# Patient Record
Sex: Female | Born: 1940 | Race: White | Hispanic: No | Smoking: Former smoker
Health system: Southern US, Community
[De-identification: ages and names within clinical notes are randomized; demographics above are authoritative.]

## PROBLEM LIST (undated history)

## (undated) DIAGNOSIS — M797 Fibromyalgia: Secondary | ICD-10-CM

## (undated) DIAGNOSIS — J449 Chronic obstructive pulmonary disease, unspecified: Secondary | ICD-10-CM

## (undated) HISTORY — DX: Chronic obstructive pulmonary disease, unspecified: J44.9

## (undated) HISTORY — PX: TOTAL ABDOMINAL HYSTERECTOMY: SHX209

## (undated) HISTORY — DX: Fibromyalgia: M79.7

---

## 1995-05-17 ENCOUNTER — Encounter: Payer: Self-pay | Admitting: Internal Medicine

## 2004-04-24 ENCOUNTER — Ambulatory Visit: Payer: Self-pay | Admitting: Internal Medicine

## 2004-06-23 ENCOUNTER — Ambulatory Visit: Payer: Self-pay | Admitting: Internal Medicine

## 2004-08-26 ENCOUNTER — Ambulatory Visit: Payer: Self-pay | Admitting: Internal Medicine

## 2004-12-16 ENCOUNTER — Ambulatory Visit: Payer: Self-pay | Admitting: Internal Medicine

## 2005-04-19 ENCOUNTER — Ambulatory Visit: Payer: Self-pay | Admitting: Internal Medicine

## 2005-09-02 ENCOUNTER — Ambulatory Visit: Payer: Self-pay | Admitting: Internal Medicine

## 2005-09-03 ENCOUNTER — Ambulatory Visit: Payer: Self-pay | Admitting: Internal Medicine

## 2005-12-22 ENCOUNTER — Ambulatory Visit: Payer: Self-pay | Admitting: Internal Medicine

## 2006-04-01 ENCOUNTER — Ambulatory Visit: Payer: Self-pay | Admitting: Internal Medicine

## 2006-07-20 ENCOUNTER — Ambulatory Visit: Payer: Self-pay | Admitting: Internal Medicine

## 2006-08-16 ENCOUNTER — Ambulatory Visit: Payer: Self-pay | Admitting: Internal Medicine

## 2006-11-09 ENCOUNTER — Ambulatory Visit: Payer: Self-pay | Admitting: Internal Medicine

## 2007-02-22 ENCOUNTER — Ambulatory Visit: Payer: Self-pay | Admitting: Internal Medicine

## 2007-06-21 ENCOUNTER — Ambulatory Visit: Payer: Self-pay | Admitting: Internal Medicine

## 2007-08-15 DIAGNOSIS — J302 Other seasonal allergic rhinitis: Secondary | ICD-10-CM

## 2007-08-15 DIAGNOSIS — J3089 Other allergic rhinitis: Secondary | ICD-10-CM

## 2007-08-15 HISTORY — DX: Other seasonal allergic rhinitis: J30.2

## 2007-08-16 ENCOUNTER — Ambulatory Visit: Payer: Self-pay | Admitting: Internal Medicine

## 2007-10-25 ENCOUNTER — Ambulatory Visit: Payer: Self-pay | Admitting: Internal Medicine

## 2008-02-16 ENCOUNTER — Ambulatory Visit: Payer: Self-pay | Admitting: Internal Medicine

## 2008-06-06 ENCOUNTER — Ambulatory Visit: Payer: Self-pay | Admitting: Internal Medicine

## 2008-08-14 ENCOUNTER — Encounter: Payer: Self-pay | Admitting: Internal Medicine

## 2008-08-19 ENCOUNTER — Telehealth (INDEPENDENT_AMBULATORY_CARE_PROVIDER_SITE_OTHER): Payer: Self-pay | Admitting: *Deleted

## 2008-08-20 ENCOUNTER — Encounter: Payer: Self-pay | Admitting: Internal Medicine

## 2008-08-21 ENCOUNTER — Ambulatory Visit: Payer: Self-pay | Admitting: Internal Medicine

## 2008-09-02 ENCOUNTER — Telehealth: Payer: Self-pay | Admitting: Internal Medicine

## 2008-09-03 ENCOUNTER — Encounter: Payer: Self-pay | Admitting: Internal Medicine

## 2008-09-06 ENCOUNTER — Telehealth (INDEPENDENT_AMBULATORY_CARE_PROVIDER_SITE_OTHER): Payer: Self-pay | Admitting: *Deleted

## 2008-09-09 ENCOUNTER — Ambulatory Visit: Payer: Self-pay | Admitting: Internal Medicine

## 2008-10-09 ENCOUNTER — Ambulatory Visit: Payer: Self-pay | Admitting: Internal Medicine

## 2008-10-19 ENCOUNTER — Encounter: Payer: Self-pay | Admitting: Internal Medicine

## 2008-10-22 ENCOUNTER — Ambulatory Visit: Payer: Self-pay | Admitting: Internal Medicine

## 2008-11-11 ENCOUNTER — Ambulatory Visit: Payer: Self-pay | Admitting: Internal Medicine

## 2008-12-09 ENCOUNTER — Ambulatory Visit: Payer: Self-pay | Admitting: Internal Medicine

## 2009-06-17 ENCOUNTER — Ambulatory Visit: Payer: Self-pay | Admitting: Internal Medicine

## 2009-06-23 ENCOUNTER — Encounter: Payer: Self-pay | Admitting: Internal Medicine

## 2009-06-30 ENCOUNTER — Telehealth: Payer: Self-pay | Admitting: Internal Medicine

## 2009-08-20 ENCOUNTER — Ambulatory Visit: Payer: Self-pay | Admitting: Internal Medicine

## 2009-08-20 DIAGNOSIS — R0902 Hypoxemia: Secondary | ICD-10-CM | POA: Insufficient documentation

## 2009-08-20 HISTORY — DX: Hypoxemia: R09.02

## 2009-08-27 ENCOUNTER — Encounter: Payer: Self-pay | Admitting: Internal Medicine

## 2009-10-09 ENCOUNTER — Ambulatory Visit: Payer: Self-pay | Admitting: Internal Medicine

## 2009-10-21 ENCOUNTER — Ambulatory Visit: Payer: Self-pay | Admitting: Internal Medicine

## 2009-10-21 DIAGNOSIS — J410 Simple chronic bronchitis: Secondary | ICD-10-CM

## 2009-10-21 HISTORY — DX: Simple chronic bronchitis: J41.0

## 2010-02-25 ENCOUNTER — Ambulatory Visit: Payer: Self-pay | Admitting: Internal Medicine

## 2010-03-10 NOTE — Miscellaneous (Signed)
Summary: Injection Record/[facility]  Injection Record/[facility]   Imported By: Sherian Rein 07/01/2009 08:48:04  _____________________________________________________________________  External Attachment:    Type:   Image     Comment:   External Document

## 2010-03-10 NOTE — Miscellaneous (Signed)
Summary: Injection Orders / [facility]    Injection Orders / [facility]    Imported By: Lennie Odor 07/08/2009 15:43:01  _____________________________________________________________________  External Attachment:    Type:   Image     Comment:   External Document

## 2010-03-10 NOTE — Progress Notes (Signed)
Summary: FYI- her primary MD has dx'd  Asthma  Phone Note Call from Patient Call back at Home Phone [phone]   Caller: Patient Call For: Jayden Rudge Summary of Call: FYI: Pt states she was diagnosed with asthma by her primary and that she would bring in xray disks when she comes for her 7/13 appt with [facility]. Initial call taken by: Darletta Moll,  Jun 30, 2009 11:54 AM  Follow-up for Phone Call        per pt FYI only. Carron Curie CMA  Jun 30, 2009 11:55 AM   Additional Follow-up for Phone Call Additional follow up Details #1::        Noted Additional Follow-up by: Waymon Budge MD,  Jun 30, 2009 1:35 PM

## 2010-03-10 NOTE — Assessment & Plan Note (Signed)
Summary: 2 month return/mhh   Primary Provider/Referring Provider:  [facility]/ Dr [name]  CC:  f/u allergies.  History of Present Illness:  August 20, 2009- Allergic rhiniits, Asthma Had a chest CT for wheezing dyspnea, done at [facility] with disk brought for my review.. Images reviewed. No focal lesion- Report agrees, WNL. They had given her a  Proair and we discussed use- few times/ week.  Since last here has been giving her own allergy shots. Has Epipen. I discussed risk, goals and use of Epipen. Sat here today 88% on arrival- 91% with rest. Denies routine cough or wheeze. She is aware of exertional dyspnea more over the past year. She denies chest pain, palpitation, edema.  October 21, 2009- Allergic rhinitis, COPD Brings PFT done at [facility]- mild obstruction w/o response to bronchodilator. DLCO reduced. Has used rescue inhaler not more than 3-4 times since last here in July. Hasn't needed her rescue inhaer. Only noticed wheezing once since last here- mild then- while she was lying down.  Discussed her smoking hx and also the effect of her weight. She is trying now to lose weight.  No hx of ischemic heart disease, but on atenolol because of hx of heart murmur. Denies hx of anemia or blood clots. Negative contrast CT in May- reviewed. Feels better now that weather is starting to cool.  She admits slow walking and limiting dyspnea on stairs, inclines or brisk walking. No hx glaucoma.  PFT-08/27/09- Mild obstructive disease w/o rep to BD. FEV1 1.84/ 82%; FEV1/FVC 0.64, reduced small airways. DLCO Reduced 53%   Preventive Screening-Counseling & Management  Alcohol-Tobacco     Smoking Status: quit > 6 months     Packs/Day: 1.0     Year Started: age 11     Year Quit: 1990  Current Medications (verified): 1)  Atenolol 100 Mg  Tabs (Atenolol) .... Once Daily 2)  Hyzaar 100-12.5 Mg Tabs (Losartan Potassium-Hctz) .... Take 1 Tablet By Mouth Once A Day 3)  Bupropion Hcl 150  Mg  Tb12 (Bupropion Hcl) .Marland Kitchen.. 1 Tab Two Times A Day 4)  Alprazolam 0.5 Mg  Tbdp (Alprazolam) .... Once Daily 5)  Claritin-D 12 Hour 5-120 Mg  Tb12 (Loratadine-Pseudoephedrine) .... Once Daily 6)  Allergy Vaccine Restart Go (W-E) .... Rebuild To 1:50 7)  Epipen 0.3 Mg/0.56ml (1:1000)  Devi (Epinephrine Hcl (Anaphylaxis)) .... As Needed 8)  Ventolin Hfa 108 (90 Base) Mcg/act Aers (Albuterol Sulfate) .Marland Kitchen.. 1-2 Puffs By Mouth Every 6 Hours As Needed 9)  Amoxil Before Dental Procedures 10)  Albuterol Sulfate (2.5 Mg/60ml) 0.083% Nebu (Albuterol Sulfate) .... As Directed  Allergies (verified): 1)  ! Sulfa 2)  ! Codeine  Past History:  Past Surgical History: Last updated: 09/03/2008 Total Abdominal Hysterectomy  Family History: Last updated: 2008/09/03 mother deceased age 58 from pneumonia  hx o allergies/arthritis father deceased age 77 from massive MI 1 sibling alive age 35 1 sibling alive age 65  hx of bipolar 1 silbing alive age 37  hx of DM son has hx of DM  Social History: Last updated: 09/03/2008 quit smoking in 1990  smoked for 20 years exposed to second hand smoke no caffeine use never any drug use no alcohol use widowed 3 children  Risk Factors: Smoking Status: quit > 6 months (10/21/2009) Packs/Day: 1.0 (10/21/2009)  Past Medical History: Allergic Rhinitis Skint test pos 1997, 03-Sep-2008 COPD- 08/27/09- FEV1 1.84/ 82%; R 0.64; 25-75% 0.79/ 40%; DLCO 53%. No resp to BD fibromyalgia allergic conjunctivitis  Social History: Packs/Day:  1.0  Review of Systems      See HPI       The patient complains of shortness of breath with activity and weight change.  The patient denies shortness of breath at rest, productive cough, non-productive cough, coughing up blood, chest pain, irregular heartbeats, acid heartburn, indigestion, loss of appetite, abdominal pain, difficulty swallowing, sore throat, tooth/dental problems, headaches, nasal congestion/difficulty breathing through  nose, and sneezing.    Vital Signs:  Patient profile:   70 year old female Height:      64 inches Weight:      221.50 pounds BMI:     38.16 O2 Sat:      93 % on Room air Pulse rate:   66 / minute BP sitting:   126 / 84  (left arm) Cuff size:   regular  Vitals Entered By: Vivianne Spence  O2 Flow:  Room air CC: f/u allergies Comments Medications reviewed with patient ,pt. phone # verified. Elray Buba RN  October 21, 2009 11:04 AM    Physical Exam  Additional Exam:  General: A/Ox3; pleasant and cooperative, NAD, obese, dynamic/ talkative SKIN: no rash, lesions NODES: no lymphadenopathy HEENT: [place]/AT, EOM- WNL, Conjuctivae- clear, PERRLA, TM-WNL, Nose- clear, Throat- clear and wnl, Mallampati III, tonsils present, not hoarse, no stridor, drainage or erythema NECK: Supple w/ fair ROM, JVD- none, normal carotid impulses w/o bruits Thyroid-  CHEST: Clear to P&A, distant HEART: RRR, no m/g/r heard ABDOMEN: Soft and nl; overweight ZOX:WRUE, nl pulses, no edema  NEURO: Grossly intact to observation      Impression & Recommendations:  Problem # 1:  HYPOXEMIA (ICD-799.02)  PFT favors her former smoking and small airways obstructive disease, plus her obesity and deconditioning as the main reasons for exertional dyspnea. She is trying to lose some weight, has no access to a pulmonary rehab program at home, but can do at least some walking for endurance. We will give sample Spiriva to try, but I don't expect much progress on weight .  Flu vaccine discussed and requested.  Problem # 2:  COPD (ICD-496) As discussed above. Not much active bronchits.  Other Orders: Est. Patient Level IV (45409) Flu Vaccine 54yrs + MEDICARE PATIENTS (W1191) Administration Flu vaccine - MCR (Y7829)  Patient Instructions: 1)  Please schedule a follow-up appointment in 6 months. 2)  Sample Spiriva - 1  daily . 3)  See if it gradually improves your shortness of breat 4)  Flu vax    Flu  Vaccine Consent Questions     Do you have a history of severe allergic reactions to this vaccine? no    Any prior history of allergic reactions to egg and/or gelatin? no    Do you have a sensitivity to the preservative Thimersol? no    Do you have a past history of Guillan-Barre Syndrome? no    Do you currently have an acute febrile illness? no    Have you ever had a severe reaction to latex? no    Vaccine information given and explained to patient? yes    Are you currently pregnant? no    Lot Number:[id number]   Exp Date:08/08/2010   Site Given  Left Deltoid IMflu  Elray Buba RN  October 21, 2009 12:07 PM

## 2010-03-10 NOTE — Assessment & Plan Note (Signed)
Summary: 1 year/ mbw   Primary Provider/Referring Provider:  [facility]/ Dr [name]  CC:  1 year follow up and c/o sob worse with exertion 02 sats were 88% with ambulation.  History of Present Illness: Primary Provider/Referring Provider:  [facility]/ Dr Jonny Ruiz Kihm7/09- ALLERGIC RHINITIS (ICD-477.9) 09-18-2008- Allergic rhinitis In May got head congestion and started erythromycin, but blames herself for delaying a day. Got laryngitis, no cough, throat was irritiated but no fever or chills. Went to [facility] June 2. Got Zpak, Rocephin inj. CXR ?'d pneumonia. Ret f/u no better June 12- Stil head and sinus congestion. Given neb Rx, levaquin, prednisone, Ventolin inhaler. She noted some positional wheeze. Third visit- Extended Levaquin and prednisone. V/Q was very low probability for PE. CXR report we have from 7/3 was clear- NAD. Last November-. Began itching, no rash- went to 2 diffrerent dermatologists. The second gave her hydroxyzine 10 mg. They suggested she have repeat allergy testing. Uses Eucerin, changed detergents to [place].  Acutely-. She had CXR- NAD, minor fibrotic changes. VQ Lung Scan -very low probability after low oximetry and dyspnea. Today- still raspy feeling in throat. She still itches, off hydroxyzine and claritin, no rash. Chest feels ok.  Skin tests- Pos- grass, weed, tree, dust, mold.  August 20, 2009- Allergic rhiniits, Asthma Had a chest CT for wheezing dyspnea, done at [facility] with disk brought for my review.. Images reviewed. No focal lesion- Report agrees, WNL. They had given her a  Proair and we discussed use- few times/ week.  Since last here has been giving her own allergy shots. Has Epipen. I discussed risk, goals and use of Epipen. Sat here today 88% on arrival- 91% with rest. Denies routine cough or wheeze. She is aware of exertional dyspnea more over the past year. She denies chest pain, palpitation, edema.    Preventive  Screening-Counseling & Management  Alcohol-Tobacco     Smoking Status: quit > 6 months  Current Medications (verified): 1)  Atenolol 100 Mg  Tabs (Atenolol) .... Once Daily 2)  Hyzaar 100-12.5 Mg Tabs (Losartan Potassium-Hctz) .... Take 1 Tablet By Mouth Once A Day 3)  Bupropion Hcl 150 Mg  Tb12 (Bupropion Hcl) .Marland Kitchen.. 1 Tab Two Times A Day 4)  Alprazolam 0.5 Mg  Tbdp (Alprazolam) .... Once Daily 5)  Claritin-D 12 Hour 5-120 Mg  Tb12 (Loratadine-Pseudoephedrine) .... Once Daily 6)  Allergy Vaccine Restart Go (W-E) .... Rebuild To 1:50 7)  Epipen 0.3 Mg/0.36ml (1:1000)  Devi (Epinephrine Hcl (Anaphylaxis)) .... As Needed 8)  Ventolin Hfa 108 (90 Base) Mcg/act Aers (Albuterol Sulfate) .Marland Kitchen.. 1-2 Puffs By Mouth Every 6 Hours As Needed 9)  Amoxil Before Dental Procedures 10)  Albuterol Sulfate (2.5 Mg/58ml) 0.083% Nebu (Albuterol Sulfate) .... As Directed  Allergies (verified): 1)  ! Sulfa 2)  ! Codeine  Past History:  Past Medical History: Last updated: 09-18-2008 Allergic Rhinitis Skint test pos 1997, Sep 18, 2008 fibromyalgia allergic conjunctivitis  Past Surgical History: Last updated: September 18, 2008 Total Abdominal Hysterectomy  Family History: Last updated: 2008-09-18 mother deceased age 34 from pneumonia  hx o allergies/arthritis father deceased age 91 from massive MI 1 sibling alive age 46 1 sibling alive age 11  hx of bipolar 1 silbing alive age 16  hx of DM son has hx of DM  Social History: Last updated: 18-Sep-2008 quit smoking in 1990  smoked for 20 years exposed to second hand smoke no caffeine use never any drug use no alcohol use widowed 3 children  Risk  Factors: Smoking Status: quit > 6 months (08/20/2009)  Social History: Smoking Status:  quit > 6 months  Review of Systems      See HPI       The patient complains of shortness of breath with activity.  The patient denies shortness of breath at rest, productive cough, non-productive cough, coughing up blood,  chest pain, irregular heartbeats, acid heartburn, indigestion, loss of appetite, weight change, abdominal pain, difficulty swallowing, sore throat, tooth/dental problems, headaches, nasal congestion/difficulty breathing through nose, and sneezing.    Vital Signs:  Patient profile:   70 year old female Height:      64 inches Weight:      232 pounds BMI:     39.97 O2 Sat:      88 % on Room air Pulse rate:   72 / minute BP sitting:   120 / 60  (left arm) Cuff size:   large  Vitals Entered By: Renold Genta RCP, LPN (August 20, 2009 11:01 AM)  O2 Sat at Rest %:  88% O2 Flow:  Room air  O2 Sat Comments 02 sats 88% with ambulation and increased to 94% after rest CC: 1 year follow up, c/o sob worse with exertion 02 sats were 88% with ambulation Comments Medications reviewed with patient Renold Genta RCP, LPN  August 20, 2009 11:12 AM    Physical Exam  Additional Exam:  General: A/Ox3; pleasant and cooperative, NAD, obese, dynamic/ talkative SKIN: no rash, lesions NODES: no lymphadenopathy HEENT: Uncertain/AT, EOM- WNL, Conjuctivae- clear, PERRLA, TM-WNL, Nose- clear, Throat- clear and wnl, Mallampati III, tonsils present, not hoarse, no stridor, drainage or erythema NECK: Supple w/ fair ROM, JVD- none, normal carotid impulses w/o bruits Thyroid-  CHEST: Clear to P&A, distant HEART: RRR, no m/g/r heard ABDOMEN: Soft and nl;  QVZ:DGLO, nl pulses, no edema  NEURO: Grossly intact to observation      Impression & Recommendations:  Problem # 1:  ALLERGIC RHINITIS (ICD-477.9)  This has been controlled with her allergy vaccine and occasional antihistamine. She seems comfortable continuing.  Problem # 2:  HYPOXEMIA (ICD-799.02)  CT to my review doesn't show a significant structural abnormality, but I want to get the report. Her hypoxia may just be obesity hypoventilation, but she did smoke a long time. We need to get PFT done and it will be easier to get it through [place]. She has a  rescue inhaler and we discussed occasional use, although I can't tell that she wheezes much. This hypoxemia might come from a fixed asthma and her weight, but chronic left ventricular failure, chronic thromboembolic disease might also do it. PFT should help, since structural abnormalities weren't seen on CT.  Medications Added to Medication List This Visit: 1)  Albuterol Sulfate (2.5 Mg/66ml) 0.083% Nebu (Albuterol sulfate) .... As directed  Other Orders: Est. Patient Level III (75643) Est. Patient Level IV (32951)  Patient Instructions: 1)  Please schedule a follow-up appointment in 2 months. 2)  Script to show your doctor for help getting referral for Pulmonary Function Test. 3)  Continue allergy shots 4)  Use the rescue inhaler Proair: 2 puffs, up to 4 x daily when needed. You can skip it when you don't need it for chest tightness, wheezing.

## 2010-04-21 ENCOUNTER — Ambulatory Visit (INDEPENDENT_AMBULATORY_CARE_PROVIDER_SITE_OTHER): Payer: Medicare Other | Admitting: Internal Medicine

## 2010-04-21 ENCOUNTER — Encounter: Payer: Self-pay | Admitting: Internal Medicine

## 2010-04-21 DIAGNOSIS — J449 Chronic obstructive pulmonary disease, unspecified: Secondary | ICD-10-CM

## 2010-04-21 DIAGNOSIS — J309 Allergic rhinitis, unspecified: Secondary | ICD-10-CM

## 2010-04-28 NOTE — Assessment & Plan Note (Signed)
Summary: 6 month rov   Primary Provider/Referring Provider:  [facility]/ Dr [name]  CC:  6 month follow up.  Pt states breathing is doing well overall.  Marland Kitchen  History of Present Illness:  October 21, 2009- Allergic rhinitis, COPD Brings PFT done at [facility]- mild obstruction w/o response to bronchodilator. DLCO reduced. Has used rescue inhaler not more than 3-4 times since last here in July. Hasn't needed her rescue inhaer. Only noticed wheezing once since last here- mild then- while she was lying down.  Discussed her smoking hx and also the effect of her weight. She is trying now to lose weight.  No hx of ischemic heart disease, but on atenolol because of hx of heart murmur. Denies hx of anemia or blood clots. Negative contrast CT in May- reviewed. Feels better now that weather is starting to cool.  She admits slow walking and limiting dyspnea on stairs, inclines or brisk walking. No hx glaucoma.  PFT-08/27/09- Mild obstructive disease w/o rep to BD. FEV1 1.84/ 82%; FEV1/FVC 0.64, reduced small airways. DLCO Reduced 53%  April 21, 2010- Allergic rhinitis, COPD Nurse-CC: 6 month follow up.  Pt states breathing is doing well overall.  We had given her Spiriva and advised exercise to improve DOE at last visit.  Her breathing has seemed better than she expected since last here. Gargle warm saline and doing two times a day nasal saline rinse to ward off colds.  Doing well with allergy vaccine at 1:50 with no reactions. We discussed moving up to 1:10.       Preventive Screening-Counseling & Management  Alcohol-Tobacco     Smoking Status: quit > 6 months     Packs/Day: 1.0     Year Started: age 14     Year Quit: 1990  Current Medications (verified): 1)  Atenolol 100 Mg  Tabs (Atenolol) .... Once Daily 2)  Hyzaar 100-12.5 Mg Tabs (Losartan Potassium-Hctz) .... Take 1 Tablet By Mouth Once A Day 3)  Bupropion Hcl 150 Mg  Tb12 (Bupropion Hcl) .Marland Kitchen.. 1 Tab Two Times A Day 4)   Alprazolam 0.5 Mg  Tbdp (Alprazolam) .... Once Daily 5)  Claritin-D 12 Hour 5-120 Mg  Tb12 (Loratadine-Pseudoephedrine) .... Once Daily 6)  Allergy Vaccine Restart Go (W-E) .... Rebuild To 1:50 7)  Epipen 0.3 Mg/0.59ml (1:1000)  Devi (Epinephrine Hcl (Anaphylaxis)) .... As Needed 8)  Ventolin Hfa 108 (90 Base) Mcg/act Aers (Albuterol Sulfate) .Marland Kitchen.. 1-2 Puffs By Mouth Every 6 Hours As Needed 9)  Amoxil Before Dental Procedures 10)  Albuterol Sulfate (2.5 Mg/31ml) 0.083% Nebu (Albuterol Sulfate) .... As Directed  Allergies (verified): 1)  ! Sulfa 2)  ! Codeine  Past History:  Past Medical History: Last updated: 10/21/2009 Allergic Rhinitis Skint test pos 1997, 08/22/08 COPD- 08/27/09- FEV1 1.84/ 82%; R 0.64; 25-75% 0.79/ 40%; DLCO 53%. No resp to BD fibromyalgia allergic conjunctivitis  Past Surgical History: Last updated: 22-Aug-2008 Total Abdominal Hysterectomy  Family History: Last updated: 2008/08/22 mother deceased age 38 from pneumonia  hx o allergies/arthritis father deceased age 52 from massive MI 1 sibling alive age 58 1 sibling alive age 63  hx of bipolar 1 silbing alive age 62  hx of DM son has hx of DM  Social History: Last updated: 08/22/2008 quit smoking in 1990  smoked for 20 years exposed to second hand smoke no caffeine use never any drug use no alcohol use widowed 3 children  Risk Factors: Smoking Status: quit > 6 months (04/21/2010) Packs/Day: 1.0 (04/21/2010)  Review of Systems      See HPI       The patient complains of shortness of breath with activity.  The patient denies shortness of breath at rest, productive cough, non-productive cough, coughing up blood, chest pain, irregular heartbeats, acid heartburn, indigestion, loss of appetite, weight change, abdominal pain, difficulty swallowing, sore throat, tooth/dental problems, headaches, nasal congestion/difficulty breathing through nose, and sneezing.    Vital Signs:  Patient profile:   70 year  old female Height:      65 inches Weight:      196.13 pounds BMI:     32.76 BP sitting:   108 / 58  (right arm) Cuff size:   large  Vitals Entered By: Gweneth Dimitri RN (April 21, 2010 11:08 AM)  O2 Flow:  Room air CC: 6 month follow up.  Pt states breathing is doing well overall.   Comments Medications reviewed with patient Daytime contact number verified with patient. Gweneth Dimitri RN  April 21, 2010 11:09 AM    Physical Exam  Additional Exam:  General: A/Ox3; pleasant and cooperative, NAD, obese, dynamic/ talkative, looks calm SKIN: no rash, lesions NODES: no lymphadenopathy HEENT: [place]/AT, EOM- WNL, Conjuctivae- clear, PERRLA, TM-WNL, Nose- clear, Throat- clear and wnl,  Mallampati III, tonsils present, not hoarse, no stridor, drainage or erythema NECK: Supple w/ fair ROM, JVD- none, normal carotid impulses w/o bruits Thyroid-  CHEST: Clear to P&A, distant HEART: RRR, no m/g/r heard ABDOMEN: Soft and nl; overweight ZOX:WRUE, nl pulses, no edema  NEURO: Grossly intact to observation      Impression & Recommendations:  Problem # 1:  COPD (ICD-496) Doing well, comfortable appearing and very clear today.  Problem # 2:  ALLERGIC RHINITIS (ICD-477.9)  Doing very well.Stll notes postnasal drip she says erythromycin helps, to prevent strep throats.  We will try move up to 1:10- discussed.   Other Orders: Est. Patient Level III (45409)  Patient Instructions: 1)  Please schedule a follow-up appointment in 6 months. 2)  I will have the allergy lab increase your vaccine strength to 1:10 next time you order. They will send directions. Please call as needed. 3)  Epipen refilled in in case of severe allergic reaction.  Prescriptions: EPIPEN 0.3 MG/0.3ML (1:1000)  DEVI (EPINEPHRINE HCL (ANAPHYLAXIS)) as needed  #1 x prn   Entered and Authorized by:   Waymon Budge MD   Signed by:   Waymon Budge MD on 04/21/2010   Method used:   Print then Give to Patient   RxID:    [id number]

## 2010-06-23 NOTE — Assessment & Plan Note (Signed)
[facility]                             PULMONARY OFFICE NOTE   NAME:[name], [name]                         MRN:          [id number]  DATE:08/16/2006                            DOB:          01/25/1941    PROBLEM:  1. Allergic rhinitis.  2. Allergic conjunctivitis.  3. Fibromyalgia.   HISTORY:  One year followup.  She has continued to do quite well with  her allergy vaccine at 1:10.  She has not needed her EpiPen and has had  no significant reactions to her vaccine.  She feels her injections help.  She had a little bit of epistaxis during the winter consistent with dry  air but no asthma since she has begun her allergy vaccine and no  concerns.   MEDICATIONS:  1. Atenolol 100 mg.  2. P.r.n. Claritin.  3. Vitamin B-12.  4. Cozaar 100 mg.  5. Xanax 1/2 of a 5 mg tab used most days.  6. Occasional Benadryl.  7. Amoxicillin before dental procedures (not sure this is a current      recommendation for her).   DRUG INTOLERANT SULFA AND CODEINE.   OBJECTIVE:  Weight 242 pounds, blood pressure 142/72, pulse 70, room air  saturation 95%.  This is an obese, alert woman.  Nasal airway is  unobstructed, conjunctivae are clear, nasal mucosa looks normal, there  is no mucus of significance and no polyps.  Her pharynx is clear, voice  quality normal with no stridor.  I do not find adenopathy.  LUNGS:  Fields are clear and breathing is unlabored.  HEART:  Sounds are regular without murmur.   IMPRESSION:  1. Allergic rhinitis.  2. Allergic conjunctivitis, adequately controlled.   PLAN:  Continue allergy vaccine.  Suggest nasal saline gel during winter  dryness.  Schedule return one year, earlier p.r.n.    Clinton D. Maple Hudson, MD, [name], FACP  Electronically Signed   CDY/MedQ  DD: 08/30/2006  DT: 08/30/2006  Job #: [id number]   cc:   [name]

## 2010-06-26 NOTE — Assessment & Plan Note (Signed)
[facility]                               PULMONARY OFFICE NOTE   NAME:SPIVEYTanganyika, Bowlds                         MRN:          [id number]  DATE:09/03/2005                            DOB:          06-13-1940    PROBLEM:  1.  Allergic rhinitis.  2.  Allergic conjunctivitis.  3.  Fibromyalgia.   HISTORY:  This is a one-year followup for Mrs. Siebel who takes daily  Claritin.  She says that she has had little itching of the eyes this spring  and summer.  She uses Neosporin and occasional Benadryl for irritation in  her nose and says she has no asthma as long as she takes her allergy shots.  She has been giving her own injections, and I talked today with her about  risk benefit considerations, anaphylaxis, Epi-Pen, beta blocker interference  with Epi-Pen and policy and advice concerning administration outside of a  medical office.  She has reviewed and signed a waiver form and chooses to  accept the risk of continuing to give her own injections.  She is needing to  establish a new primary physician, as hers apparently is moving or changing  it's practice.   MEDICATIONS:  1.  Atenolol 100 mg.  2.  Multivitamins.  3.  Potassium.  4.  Claritin.  5.  Magnesium.  6.  Cozaar 100 mg.  7.  Allergy vaccine.  8.  Xanax.  9.  Benadryl.  10. Amoxicillin for dental procedures.  11. Epi Pen.  12. Drug intolerant of sulfa and codeine.   OBJECTIVE:  VITAL SIGNS:  Weight 242 pounds, BP 142/72, pulse regular 70,  room air saturation 95%.  GENERAL:  She is obese, alert, a very comfortable-appearing woman.  SKIN:  No evident rash, adenopathy:  None at the neck, shoulders or axillae.  HEENT:  Conjunctivae are clear.  Nasal mucosa looks normal.  Pharynx is not  injected.  There is no visible postnasal drainage, and voice quality is  normal.  NECK:  Without stridor or neck vein distention.  CHEST:  Quiet, clear lung fields.  No cough, wheeze or rales.  HEART:   Sounds are regular without murmur or gallop.  EXTREMITIES:  Without cyanosis, clubbing or edema.   IMPRESSION:  Clinically, she is stable.  I think she is able to make  thoughtful decisions in her own best interest.  She is going to discuss  allergy vaccine administration with her new primary physician when  established.  Schedule return visit here in one year, earlier p.r.n.                                   Clinton D. Maple Hudson, MD, [facility], FACP   CDY/MedQ  DD:  09/06/2005  DT:  09/06/2005  Job #:  [id number]   cc:   Collier Flowers, M.D.

## 2010-08-09 DIAGNOSIS — I214 Non-ST elevation (NSTEMI) myocardial infarction: Secondary | ICD-10-CM

## 2010-08-09 HISTORY — DX: Non-ST elevation (NSTEMI) myocardial infarction: I21.4

## 2010-08-24 HISTORY — PX: CAROTID STENT: SHX1301

## 2010-09-25 ENCOUNTER — Ambulatory Visit (INDEPENDENT_AMBULATORY_CARE_PROVIDER_SITE_OTHER): Payer: Medicare Other

## 2010-09-25 DIAGNOSIS — J309 Allergic rhinitis, unspecified: Secondary | ICD-10-CM

## 2010-10-22 ENCOUNTER — Ambulatory Visit (INDEPENDENT_AMBULATORY_CARE_PROVIDER_SITE_OTHER): Payer: Medicare Other | Admitting: Internal Medicine

## 2010-10-22 ENCOUNTER — Encounter: Payer: Self-pay | Admitting: Internal Medicine

## 2010-10-22 VITALS — BP 136/78 | HR 67 | Ht 65.0 in | Wt 196.4 lb

## 2010-10-22 DIAGNOSIS — J449 Chronic obstructive pulmonary disease, unspecified: Secondary | ICD-10-CM

## 2010-10-22 DIAGNOSIS — J309 Allergic rhinitis, unspecified: Secondary | ICD-10-CM

## 2010-10-22 DIAGNOSIS — Z23 Encounter for immunization: Secondary | ICD-10-CM

## 2010-10-22 NOTE — Patient Instructions (Signed)
Flu vax  Continue your allergy shots.   Please call us as needed.

## 2010-10-22 NOTE — Assessment & Plan Note (Signed)
Good control Ok to continue allergy vaccine

## 2010-10-22 NOTE — Progress Notes (Signed)
Subjective:    Patient ID: [name], female    DOB: Mar 21, 1940, 70 y.o.   MRN: [id number]  HPI 10/22/10- 69 year old female former smoker followed for allergic rhinitis, COPD, complicated by CAD/MI Last here March 13,2012- has been in hosp twice for MI by enzymes- had Mi- [place] then [facility] after cardiac cath with stent. Cortisone shot for sore left shoulder yesterday.  Allergy - no problem with fall season so far. Continues allergy shots with no problem.  Denies limiting SOB, cough or wheeze now.  She brings an extremely long list of meds, supplements and etc.   Review of Systems Constitutional:   No-   weight loss, night sweats, fevers, chills, fatigue, lassitude. HEENT:   No-  headaches, difficulty swallowing, tooth/dental problems, sore throat,       No-  sneezing, itching, ear ache, nasal congestion, post nasal drip,  CV:  No-   chest pain, orthopnea, PND, swelling in lower extremities, anasarca, dizziness, palpitations Resp: No- acute  shortness of breath with exertion or at rest.              No-   productive cough,  No non-productive cough,  No-  coughing up of blood.              No-   change in color of mucus.  No- wheezing.   Skin: No-   rash or lesions. GI:  No-   heartburn, indigestion, abdominal pain, nausea, vomiting, diarrhea,                 change in bowel habits, loss of appetite GU: No-   dysuria, change in color of urine, no urgency or frequency.  No- flank pain. MS:  No-   joint pain or swelling.  No- decreased range of motion.  No- back pain. Neuro- grossly normal to observation, Or:  Psych:  No- change in mood or affect. No depression or anxiety.  No memory loss.      Objective:   Physical Exam General- Alert, Oriented, Affect-appropriate, Distress- none acute  overweight Skin- rash-none, lesions- none, excoriation- none    Bruise right upper arm Lymphadenopathy- none Head- atraumatic            Eyes- Gross vision intact, PERRLA, conjunctivae  clear secretions            Ears- Hearing, canals- normal            Nose- Clear, no- Septal dev, mucus, polyps, erosion, perforation             Throat- Mallampati II , mucosa clear , drainage- none, tonsils- atrophic, own teeth Neck- flexible , trachea midline, no stridor , thyroid nl, carotid no bruit Chest - symmetrical excursion , unlabored           Heart/CV- RRR , no murmur , no gallop  , no rub, nl s1 s2                           - JVD- none , edema- none, stasis changes- none, varices- none           Lung- clear to P&A, wheeze- none, cough- none , dullness-none, rub- none           Chest wall-  Abd- tender-no, distended-no, bowel sounds-present, HSM- no Br/ Gen/ Rectal- Not done, not indicated Extrem- cyanosis- none, clubbing, none, atrophy- none, strength- nl Neuro- grossly intact to observation  Assessment & Plan:

## 2010-10-22 NOTE — Assessment & Plan Note (Signed)
Controlled and comfortable for now. Flu vax discussed.

## 2010-10-27 ENCOUNTER — Encounter: Payer: Self-pay | Admitting: Internal Medicine

## 2011-01-19 ENCOUNTER — Ambulatory Visit (INDEPENDENT_AMBULATORY_CARE_PROVIDER_SITE_OTHER): Payer: Medicare Other

## 2011-01-19 DIAGNOSIS — J309 Allergic rhinitis, unspecified: Secondary | ICD-10-CM

## 2011-04-22 ENCOUNTER — Ambulatory Visit (INDEPENDENT_AMBULATORY_CARE_PROVIDER_SITE_OTHER): Payer: Medicare Other | Admitting: Internal Medicine

## 2011-04-22 ENCOUNTER — Encounter: Payer: Self-pay | Admitting: Internal Medicine

## 2011-04-22 VITALS — BP 128/60 | HR 65 | Ht 65.0 in | Wt 196.8 lb

## 2011-04-22 DIAGNOSIS — J4489 Other specified chronic obstructive pulmonary disease: Secondary | ICD-10-CM

## 2011-04-22 DIAGNOSIS — J309 Allergic rhinitis, unspecified: Secondary | ICD-10-CM

## 2011-04-22 DIAGNOSIS — J449 Chronic obstructive pulmonary disease, unspecified: Secondary | ICD-10-CM

## 2011-04-22 MED ORDER — PHENYLEPHRINE HCL 1 % NA SOLN
3.0000 [drp] | Freq: Once | NASAL | Status: AC
  Administered 2011-04-22: 3 [drp] via NASAL

## 2011-04-22 MED ORDER — METHYLPREDNISOLONE ACETATE 80 MG/ML IJ SUSP
80.0000 mg | Freq: Once | INTRAMUSCULAR | Status: AC
  Administered 2011-04-22: 80 mg via INTRAMUSCULAR

## 2011-04-22 NOTE — Progress Notes (Signed)
Patient ID: Candi Leash, female    DOB: 1940/03/19, 71 y.o.   MRN: [id number]  HPI 10/22/10- 71 year old female former smoker followed for allergic rhinitis, COPD, complicated by CAD/MI Last here March 13,2012- has been in hosp twice for MI by enzymes- had Mi- [place] then [facility] after cardiac cath with stent. Cortisone shot for sore left shoulder yesterday.  Allergy - no problem with fall season so far. Continues allergy shots with no problem.  Denies limiting SOB, cough or wheeze now.  She brings an extremely long list of meds, supplements and etc.   04/22/11-  71 year old female former smoker followed for allergic rhinitis, COPD, complicated by CAD/MI Continues allergy vaccine without problems. Bronchitis this winter required 2 rounds of Augmentin. Shortness of breath and cough have resolved. Restarted on amoxicillin March 8 for rhinitis with no sinus pain.  Review of Systems-see HPI Constitutional:   No-   weight loss, night sweats, fevers, chills, fatigue, lassitude. HEENT:   No-  headaches, difficulty swallowing, tooth/dental problems, sore throat,       No-  sneezing, itching, ear ache,  +nasal congestion, post nasal drip,  CV:  No-   chest pain, orthopnea, PND, swelling in lower extremities, anasarca, dizziness, palpitations Resp: No- acute  shortness of breath with exertion or at rest.              No-   productive cough,  No non-productive cough,  No-  coughing up of blood.              No-   change in color of mucus.  No- wheezing.   Skin: No-   rash or lesions. GI:  No-   heartburn, indigestion, abdominal pain, nausea, vomiting,  GU:  MS:  No-   joint pain or swelling. . Neuro- grossly normal to observation, Or:  Psych:  No- change in mood or affect. No depression or anxiety.  No memory loss.      Objective:   Physical Exam General- Alert, Oriented, Affect-appropriate, Distress- none acute  overweight Skin- rash-none, lesions- none, excoriation- none    Bruise  right upper arm Lymphadenopathy- none Head- atraumatic            Eyes- Gross vision intact, PERRLA, conjunctivae clear secretions            Ears- Hearing, canals- normal            Nose- Clear, no- Septal dev, mucus, polyps, erosion, perforation             Throat- Mallampati II , mucosa clear , drainage- none, tonsils- atrophic, own teeth Neck- flexible , trachea midline, no stridor , thyroid nl, carotid no bruit Chest - symmetrical excursion , unlabored           Heart/CV- RRR , no murmur , no gallop  , no rub, nl s1 s2                           - JVD- none , edema- none, stasis changes- none, varices- none           Lung- clear to P&A, wheeze- none, cough- none , dullness-none, rub- none           Chest wall-  Abd- tender-no, distended-no, bowel sounds-present, HSM- no Br/ Gen/ Rectal- Not done, not indicated Extrem- cyanosis- none, clubbing, none, atrophy- none, strength- nl Neuro- grossly intact to observation

## 2011-04-22 NOTE — Patient Instructions (Signed)
Neb neo nasal  Depo 80  Sample Qnasl nasal spray -     1 spray each nostril once every day at bedtime

## 2011-04-25 ENCOUNTER — Encounter: Payer: Self-pay | Admitting: Internal Medicine

## 2011-04-25 NOTE — Assessment & Plan Note (Addendum)
Okay to continue allergy vaccine. Risk benefits and goals discussed again Plan- try steroid nasal spray

## 2011-04-25 NOTE — Assessment & Plan Note (Signed)
Wintertime bronchitis slowly resolving. She has been getting antibiotics from her primary physician.

## 2011-06-01 ENCOUNTER — Ambulatory Visit (INDEPENDENT_AMBULATORY_CARE_PROVIDER_SITE_OTHER): Payer: Medicare Other

## 2011-06-01 DIAGNOSIS — J309 Allergic rhinitis, unspecified: Secondary | ICD-10-CM

## 2011-06-16 ENCOUNTER — Telehealth: Payer: Self-pay | Admitting: *Deleted

## 2011-06-16 ENCOUNTER — Telehealth: Payer: Self-pay | Admitting: Internal Medicine

## 2011-06-16 MED ORDER — FLUTICASONE PROPIONATE 50 MCG/ACT NA SUSP: 2.0000 | Freq: Every day | NASAL | Status: DC

## 2011-06-16 MED ORDER — BECLOMETHASONE DIPROPIONATE 80 MCG/ACT NA AERS: 2.0000 | INHALATION_SPRAY | Freq: Every day | NASAL | Status: DC

## 2011-06-16 NOTE — Telephone Encounter (Signed)
Per CY-okay to RX Flonase(fluticasone) #1 2 puffs each nostril qhs with prn refills. RX has been sent.

## 2011-06-16 NOTE — Telephone Encounter (Signed)
Will send to [facility] to advise of which nasal spray to give patient. Not back in office until Thursday.

## 2011-06-16 NOTE — Telephone Encounter (Signed)
Per [facility]-okay to give RX Qnasal #1 2 puffs each nostril daily at bedtime with prn refills.

## 2011-06-16 NOTE — Telephone Encounter (Signed)
Sample given to patient 04-22-11; please advise if okay to give Rx. Thanks.

## 2011-06-16 NOTE — Telephone Encounter (Signed)
Pt aware RX has been sent 

## 2011-06-16 NOTE — Telephone Encounter (Signed)
Dr.Young, Mrs.Newburg likes the Qnasel aerosol you gave her. She would like for you to  write her a rx and call it in to [facility](Please) cb [phone] pt.cb [phone]

## 2011-09-16 ENCOUNTER — Ambulatory Visit (INDEPENDENT_AMBULATORY_CARE_PROVIDER_SITE_OTHER): Payer: Medicare Other

## 2011-09-16 DIAGNOSIS — J309 Allergic rhinitis, unspecified: Secondary | ICD-10-CM

## 2011-10-25 ENCOUNTER — Ambulatory Visit (INDEPENDENT_AMBULATORY_CARE_PROVIDER_SITE_OTHER): Payer: Medicare Other | Admitting: Internal Medicine

## 2011-10-25 ENCOUNTER — Encounter: Payer: Self-pay | Admitting: Internal Medicine

## 2011-10-25 VITALS — BP 120/76 | HR 65 | Ht 65.0 in | Wt 199.6 lb

## 2011-10-25 DIAGNOSIS — J309 Allergic rhinitis, unspecified: Secondary | ICD-10-CM

## 2011-10-25 DIAGNOSIS — Z23 Encounter for immunization: Secondary | ICD-10-CM

## 2011-10-25 DIAGNOSIS — J449 Chronic obstructive pulmonary disease, unspecified: Secondary | ICD-10-CM

## 2011-10-25 MED ORDER — AZELASTINE-FLUTICASONE 137-50 MCG/ACT NA SUSP: 2.0000 | Freq: Every day | NASAL | Status: DC

## 2011-10-25 NOTE — Progress Notes (Signed)
Patient ID: [name], female    DOB: Oct 18, 1940, 71 y.o.   MRN: [id number]  HPI 10/22/10- 71 year old female former smoker followed for allergic rhinitis, COPD, complicated by CAD/MI Last here March 13,2012- has been in hosp twice for MI by enzymes- had [place]- [place] then [facility] after cardiac cath with stent. Cortisone shot for sore left shoulder yesterday.  Allergy - no problem with fall season so far. Continues allergy shots with no problem.  Denies limiting SOB, cough or wheeze now.  She brings an extremely long list of meds, supplements and etc.   04/22/11-  71 year old female former smoker followed for allergic rhinitis, COPD, complicated by CAD/MI Continues allergy vaccine without problems. Bronchitis this winter required 2 rounds of Augmentin. Shortness of breath and cough have resolved. Restarted on amoxicillin March 8 for rhinitis with no sinus pain.  10/25/11-71 year old female former smoker followed for allergic rhinitis, COPD, complicated by CAD/MI  Still on vaccine 1:10 GO and doing well;has started using Zyrtec and its helping. Denies any SOB, wheezing, cough, or congestion at this time. Mild postnasal drip, not worth treating differently COPD assessment test (CABG) scored 20/40  Review of Systems-see HPI Constitutional:   No-   weight loss, night sweats, fevers, chills, fatigue, lassitude. HEENT:   No-  headaches, difficulty swallowing, tooth/dental problems, sore throat,       No-  sneezing, itching, ear ache,  +nasal congestion, post nasal drip,  CV:  No-   chest pain, orthopnea, PND, swelling in lower extremities, anasarca, dizziness, palpitations Resp: No- acute  shortness of breath with exertion or at rest.              No-   productive cough,  No non-productive cough,  No-  coughing up of blood.              No-   change in color of mucus.  No- wheezing.   Skin: No-   rash or lesions. GI:  No-   heartburn, indigestion, abdominal pain, nausea, vomiting,  GU:    MS:  No-   joint pain or swelling. . Neuro- nothing unusual  Psych:  No- change in mood or affect. No depression or anxiety.  No memory loss.   Objective:   Physical Exam General- Alert, Oriented, Affect-appropriate, Distress- none acute  overweight Skin- rash-none, lesions- none, excoriation- none     Lymphadenopathy- none Head- atraumatic            Eyes- Gross vision intact, PERRLA, conjunctivae clear secretions            Ears- Hearing, canals- normal            Nose- Clear, no- Septal dev, mucus, polyps, erosion, perforation             Throat- Mallampati II , mucosa clear , drainage- none, tonsils- atrophic, own teeth Neck- flexible , trachea midline, no stridor , thyroid nl, carotid no bruit Chest - symmetrical excursion , unlabored           Heart/CV- RRR , no murmur , no gallop  , no rub, nl s1 s2                           - JVD- none , edema- none, stasis changes- none, varices- none           Lung- clear to P&A, wheeze- none, cough- none , dullness-none, rub- none  Chest wall-  Abd-  Br/ Gen/ Rectal- Not done, not indicated Extrem- cyanosis- none, clubbing, none, atrophy- none, strength- nl Neuro- grossly intact to observation

## 2011-10-25 NOTE — Patient Instructions (Addendum)
Flu vax  Sample Dymista nasal spray  1-2 puffs each nostril once daily at bedtime

## 2011-11-01 NOTE — Assessment & Plan Note (Signed)
Plan-flu vaccine with discussion

## 2011-11-01 NOTE — Assessment & Plan Note (Signed)
Okay to continue allergy vaccine. Plan-try a sample of Dymista nasal spray

## 2011-11-04 ENCOUNTER — Telehealth: Payer: Self-pay | Admitting: Internal Medicine

## 2011-11-04 NOTE — Telephone Encounter (Signed)
I do not see PNA vaccine documented in EPIC or EMR. Pt advised and she states she will call her PCP to get one. Carron Curie, CMA

## 2011-11-17 DIAGNOSIS — N8111 Cystocele, midline: Secondary | ICD-10-CM

## 2011-11-17 HISTORY — DX: Cystocele, midline: N81.11

## 2012-01-28 ENCOUNTER — Ambulatory Visit (INDEPENDENT_AMBULATORY_CARE_PROVIDER_SITE_OTHER): Payer: Medicare Other

## 2012-01-28 DIAGNOSIS — J309 Allergic rhinitis, unspecified: Secondary | ICD-10-CM

## 2012-04-24 ENCOUNTER — Ambulatory Visit: Payer: Medicare Other | Admitting: Internal Medicine

## 2012-05-18 ENCOUNTER — Ambulatory Visit (INDEPENDENT_AMBULATORY_CARE_PROVIDER_SITE_OTHER): Payer: Medicare Other

## 2012-05-18 DIAGNOSIS — J309 Allergic rhinitis, unspecified: Secondary | ICD-10-CM

## 2012-05-25 ENCOUNTER — Encounter: Payer: Self-pay | Admitting: Internal Medicine

## 2012-05-25 ENCOUNTER — Ambulatory Visit (INDEPENDENT_AMBULATORY_CARE_PROVIDER_SITE_OTHER)
Admission: RE | Admit: 2012-05-25 | Discharge: 2012-05-25 | Disposition: A | Discharge status: Home / Self Care | Payer: Medicare Other | Source: Ambulatory Visit | Attending: Internal Medicine | Admitting: Internal Medicine

## 2012-05-25 ENCOUNTER — Ambulatory Visit (INDEPENDENT_AMBULATORY_CARE_PROVIDER_SITE_OTHER): Payer: Medicare Other | Admitting: Internal Medicine

## 2012-05-25 VITALS — BP 116/76 | HR 66 | Ht 63.5 in | Wt 209.0 lb

## 2012-05-25 DIAGNOSIS — R05 Cough: Secondary | ICD-10-CM

## 2012-05-25 DIAGNOSIS — J449 Chronic obstructive pulmonary disease, unspecified: Secondary | ICD-10-CM

## 2012-05-25 DIAGNOSIS — J4489 Other specified chronic obstructive pulmonary disease: Secondary | ICD-10-CM

## 2012-05-25 DIAGNOSIS — J309 Allergic rhinitis, unspecified: Secondary | ICD-10-CM

## 2012-05-25 DIAGNOSIS — R059 Cough, unspecified: Secondary | ICD-10-CM

## 2012-05-25 DIAGNOSIS — J3089 Other allergic rhinitis: Secondary | ICD-10-CM

## 2012-05-25 MED ORDER — LEVALBUTEROL HCL 0.63 MG/3ML IN NEBU
0.6300 mg | INHALATION_SOLUTION | Freq: Once | RESPIRATORY_TRACT | Status: AC
  Administered 2012-05-25: 0.63 mg via RESPIRATORY_TRACT

## 2012-05-25 NOTE — Progress Notes (Signed)
Patient ID: [name], female    DOB: 04-26-1940, 72 y.o.   MRN: [id number]  HPI 10/22/10- 72 year old female former smoker followed for allergic rhinitis, COPD, complicated by CAD/MI Last here March 13,2012- has been in hosp twice for MI by enzymes- had [place]- [place] then [facility] after cardiac cath with stent. Cortisone shot for sore left shoulder yesterday.  Allergy - no problem with fall season so far. Continues allergy shots with no problem.  Denies limiting SOB, cough or wheeze now.  She brings an extremely long list of meds, supplements and etc.   04/22/11-  72 year old female former smoker followed for allergic rhinitis, COPD, complicated by CAD/MI Continues allergy vaccine without problems. Bronchitis this winter required 2 rounds of Augmentin. Shortness of breath and cough have resolved. Restarted on amoxicillin March 8 for rhinitis with no sinus pain.  10/25/11-72 year old female former smoker followed for allergic rhinitis, COPD, complicated by CAD/MI  Still on vaccine 1:10 GO and doing well;has started using Zyrtec and its helping. Denies any SOB, wheezing, cough, or congestion at this time. Mild postnasal drip, not worth treating differently COPD assessment test (CABG) scored 20/40  05/25/12-72 year old female former smoker followed for allergic rhinitis, COPD, complicated by CAD/MI  Still on vaccine 1:10 GO and doing well; FOLLOWS FOR: Breathing is unchanged. Reports dry cough that sounds like the "croup," SOB and wheezing at times. Denies chest pain or chest tightness. Zyrtec is still helping manage allergy symptoms. Likes Dymista nasal spray and still using Zyrtec occasionally if needed. Intermittent dry cough without voice change, dysphagia, wheeze or reflux.  Review of Systems-see HPI Constitutional:   No-   weight loss, night sweats, fevers, chills, fatigue, lassitude. HEENT:   No-  headaches, difficulty swallowing, tooth/dental problems, sore throat,       No-   sneezing, itching, ear ache,  +nasal congestion, post nasal drip,  CV:  No-   chest pain, orthopnea, PND, swelling in lower extremities, anasarca, dizziness, palpitations Resp: No- acute  shortness of breath with exertion or at rest.              No-   productive cough,  + non-productive cough,  No-  coughing up of blood.              No-   change in color of mucus.  No- wheezing.   Skin: No-   rash or lesions. GI:  No-   heartburn, indigestion, abdominal pain, nausea, vomiting,  GU:  MS:  No-   joint pain or swelling. . Neuro- nothing unusual  Psych:  No- change in mood or affect. No depression or anxiety.  No memory loss.   Objective:   Physical Exam General- Alert, Oriented, Affect-appropriate, Distress- none acute  overweight Skin- rash-none, lesions- none, excoriation- none     Lymphadenopathy- none Head- atraumatic            Eyes- Gross vision intact, PERRLA, conjunctivae clear secretions            Ears- Hearing, canals- normal            Nose- Clear, no- Septal dev, mucus, polyps, erosion, perforation             Throat- Mallampati II , mucosa clear , drainage- none, tonsils- atrophic, own teeth Neck- flexible , trachea midline, no stridor , thyroid nl, carotid no bruit Chest - symmetrical excursion , unlabored           Heart/CV- RRR , no murmur ,  no gallop  , no rub, nl s1 s2                           - JVD- none , edema- none, stasis changes- none, varices- none           Lung- clear to P&A, wheeze- none, cough- none , dullness-none, rub- none           Chest wall-  Abd-  Br/ Gen/ Rectal- Not done, not indicated Extrem- cyanosis- none, clubbing, none, atrophy- none, strength- nl Neuro- grossly intact to observation

## 2012-05-25 NOTE — Patient Instructions (Addendum)
Order- CXR   Dx COPD, cough  Neb xop 0.63  Losartan and metoprolol will sometimes cause dry cough in some people. These can be discussed with your primary doctor.

## 2012-06-01 ENCOUNTER — Encounter: Payer: Self-pay | Admitting: *Deleted

## 2012-06-01 NOTE — Progress Notes (Signed)
Quick Note:  No phone number listed for patient and number for EC is not correct. I have mailed a letter to patient to call to get results as well as we need a good, working contact number on file. ______

## 2012-06-02 NOTE — Assessment & Plan Note (Signed)
Bronchitic type cough, might be residual post viral tracheobronchitis from the winter, or seasonal pollen. Plan-chest x-ray, nebulizer treatment with Xopenex

## 2012-06-08 NOTE — Progress Notes (Signed)
Quick Note:  Pt returned call from letter sent today-made aware of results and updated chart with working phone number. ______

## 2012-09-26 ENCOUNTER — Ambulatory Visit (INDEPENDENT_AMBULATORY_CARE_PROVIDER_SITE_OTHER): Payer: Medicare Other

## 2012-09-26 DIAGNOSIS — I2789 Other specified pulmonary heart diseases: Secondary | ICD-10-CM

## 2012-11-24 ENCOUNTER — Encounter: Payer: Self-pay | Admitting: Internal Medicine

## 2012-11-24 ENCOUNTER — Ambulatory Visit (INDEPENDENT_AMBULATORY_CARE_PROVIDER_SITE_OTHER): Payer: Medicare Other | Admitting: Internal Medicine

## 2012-11-24 VITALS — BP 110/60 | HR 75 | Ht 62.75 in | Wt 201.8 lb

## 2012-11-24 DIAGNOSIS — J309 Allergic rhinitis, unspecified: Secondary | ICD-10-CM

## 2012-11-24 DIAGNOSIS — J449 Chronic obstructive pulmonary disease, unspecified: Secondary | ICD-10-CM

## 2012-11-24 DIAGNOSIS — J302 Other seasonal allergic rhinitis: Secondary | ICD-10-CM

## 2012-11-24 DIAGNOSIS — Z23 Encounter for immunization: Secondary | ICD-10-CM

## 2012-11-24 DIAGNOSIS — IMO0001 Reserved for inherently not codable concepts without codable children: Secondary | ICD-10-CM

## 2012-11-24 MED ORDER — FLUTICASONE PROPIONATE 50 MCG/ACT NA SUSP: 2.0000 | Freq: Every day | NASAL | Status: AC

## 2012-11-24 MED ORDER — EPINEPHRINE 0.3 MG/0.3ML IJ SOAJ: INTRAMUSCULAR | Status: DC

## 2012-11-24 NOTE — Patient Instructions (Signed)
Refill script for Epipen  Refill script for Flonase/ fluticasone

## 2012-11-24 NOTE — Progress Notes (Signed)
Patient ID: [name], female    DOB: [birth year], 72 y.o.   MRN: [id number]  HPI 10/22/10- 72 year old female former smoker followed for allergic rhinitis, COPD, complicated by CAD/MI Last here March 13,2012- has been in hosp twice for MI by enzymes- had MI- [place] then [facility] after cardiac cath with stent. Cortisone shot for sore left shoulder yesterday.  Allergy - no problem with fall season so far. Continues allergy shots with no problem.  Denies limiting SOB, cough or wheeze now.  She brings an extremely long list of meds, supplements and etc.   04/22/11-  72 year old female former smoker followed for allergic rhinitis, COPD, complicated by CAD/MI Continues allergy vaccine without problems. Bronchitis this winter required 2 rounds of Augmentin. Shortness of breath and cough have resolved. Restarted on amoxicillin March 8 for rhinitis with no sinus pain.  10/25/11-72 year old female former smoker followed for allergic rhinitis, COPD, complicated by CAD/MI  Still on vaccine 1:10 GO and doing well;has started using Zyrtec and its helping. Denies any SOB, wheezing, cough, or congestion at this time. Mild postnasal drip, not worth treating differently COPD assessment test (CABG) scored 20/40  05/25/12-72 year old female former smoker followed for allergic rhinitis, COPD, complicated by CAD/MI  Still on vaccine 1:10 GO and doing well; FOLLOWS FOR: Breathing is unchanged. Reports dry cough that sounds like the "croup," SOB and wheezing at times. Denies chest pain or chest tightness. Zyrtec is still helping manage allergy symptoms. Likes Dymista nasal spray and still using Zyrtec occasionally if needed. Intermittent dry cough without voice change, dysphagia, wheeze or reflux.  11/24/12- 72 year old female former smoker followed for allergic rhinitis, COPD/ chronic bronchitis, complicated by CAD/MI  Still on Allergy vaccine 1:10 GO and doing well; Follows For: Denies sob or wheezing,  cough, or chest discomfort - Occas runny nose Discussed hi dose flu vax Moved back to [place] so we discussed transfer of records there when she establishes physician.  CXR 05/25/12- IMPRESSION:  COPD/chronic bronchitis, mild. No acute superimposed process.  Original Report Authenticated By: Jeronimo Greaves, M.D.  Review of Systems-see HPI Constitutional:   No-   weight loss, night sweats, fevers, chills, fatigue, lassitude. HEENT:   No-  headaches, difficulty swallowing, tooth/dental problems, sore throat,       No-  sneezing, itching, ear ache,  +nasal congestion, post nasal drip,  CV:  No-   chest pain, orthopnea, PND, swelling in lower extremities, anasarca, dizziness, palpitations Resp: No- acute  shortness of breath with exertion or at rest.              No-   productive cough,  No- non-productive cough,  No-  coughing up of blood.              No-   change in color of mucus.  No- wheezing.   Skin: No-   rash or lesions. GI:  No-   heartburn, indigestion, abdominal pain, nausea, vomiting,  GU:  MS:  No-   joint pain or swelling. . Neuro- nothing unusual  Psych:  No- change in mood or affect. No depression or anxiety.  No memory loss.   Objective:   Physical Exam General- Alert, Oriented, Affect-appropriate, Distress- none acute  overweight Skin- rash-none, lesions- none, excoriation- none     Lymphadenopathy- none Head- atraumatic            Eyes- Gross vision intact, PERRLA, conjunctivae clear secretions            Ears- Hearing, canals-  normal            Nose- Clear, no- Septal dev, mucus, polyps, erosion, perforation             Throat- Mallampati II , mucosa clear , drainage- none, tonsils- atrophic, own teeth Neck- flexible , trachea midline, no stridor , thyroid nl, carotid no bruit Chest - symmetrical excursion , unlabored           Heart/CV- RRR , no murmur , no gallop  , no rub, nl s1 s2                           - JVD- none , edema- none, stasis changes- none, varices-  none           Lung- clear to P&A, wheeze+ trace L base, cough- none , dullness-none, rub- none           Chest wall-  Abd-  Br/ Gen/ Rectal- Not done, not indicated Extrem- cyanosis- none, clubbing, none, atrophy- none, strength- nl Neuro- grossly intact to observation

## 2012-12-10 NOTE — Assessment & Plan Note (Signed)
She continues to feel allergy shots help for now

## 2012-12-10 NOTE — Assessment & Plan Note (Signed)
PFT 08/27/2009 mild obstructive disease without response to bronchodilator FEV1 1.84/82%, FEV1/FVC 0.64, reduced small airways flows, diffusion capacity reduced at 53% Mild obstructive airways disease. Discussed meds and f/u Plan- flu vax

## 2013-01-10 ENCOUNTER — Ambulatory Visit (INDEPENDENT_AMBULATORY_CARE_PROVIDER_SITE_OTHER): Payer: Medicare Other

## 2013-01-10 DIAGNOSIS — J309 Allergic rhinitis, unspecified: Secondary | ICD-10-CM

## 2013-01-30 HISTORY — PX: CHOLECYSTECTOMY: SHX55

## 2013-01-30 HISTORY — PX: HERNIA REPAIR: SHX51

## 2013-05-04 ENCOUNTER — Ambulatory Visit (INDEPENDENT_AMBULATORY_CARE_PROVIDER_SITE_OTHER): Payer: Medicare Other

## 2013-05-04 DIAGNOSIS — J309 Allergic rhinitis, unspecified: Secondary | ICD-10-CM

## 2013-05-25 ENCOUNTER — Encounter: Payer: Self-pay | Admitting: Internal Medicine

## 2013-05-25 ENCOUNTER — Ambulatory Visit (INDEPENDENT_AMBULATORY_CARE_PROVIDER_SITE_OTHER): Payer: Medicare Other | Admitting: Internal Medicine

## 2013-05-25 VITALS — BP 142/76 | HR 78 | Ht 62.75 in | Wt 204.8 lb

## 2013-05-25 DIAGNOSIS — J309 Allergic rhinitis, unspecified: Secondary | ICD-10-CM

## 2013-05-25 DIAGNOSIS — J449 Chronic obstructive pulmonary disease, unspecified: Secondary | ICD-10-CM

## 2013-05-25 DIAGNOSIS — IMO0001 Reserved for inherently not codable concepts without codable children: Secondary | ICD-10-CM

## 2013-05-25 DIAGNOSIS — J302 Other seasonal allergic rhinitis: Secondary | ICD-10-CM

## 2013-05-25 DIAGNOSIS — J3089 Other allergic rhinitis: Secondary | ICD-10-CM

## 2013-05-25 MED ORDER — EPINEPHRINE 0.3 MG/0.3ML IJ SOAJ: 0.3000 mg | Freq: Once | INTRAMUSCULAR | Status: AC

## 2013-05-25 MED ORDER — ALBUTEROL SULFATE HFA 108 (90 BASE) MCG/ACT IN AERS: 2.0000 | INHALATION_SPRAY | Freq: Four times a day (QID) | RESPIRATORY_TRACT | Status: DC | PRN

## 2013-05-25 NOTE — Patient Instructions (Signed)
We can continue allergy vaccine 1:10  GO  Refill scripts printed for Epipen and albuterol rescue inhaler  Please call if we can help

## 2013-05-25 NOTE — Progress Notes (Signed)
Patient ID: Janet LeashNancy Wagner, female    DOB: 03/24/1940, [age] y.o.   MRN: [id number]  HPI 10/22/10- 73 year old female former smoker followed for allergic rhinitis, COPD, complicated by CAD/MI Last here March 13,2012- has been in hosp twice for MI by enzymes- had [facility]- [place] [facility] with [facility]. Cortisone shot for sore left shoulder yesterday.  Allergy - no problem with fall season so far. Continues allergy shots with no problem.  Denies limiting SOB, cough or wheeze now.  She brings an extremely long list of meds, supplements and etc.   04/22/11-  73 year old female former smoker followed for allergic rhinitis, COPD, complicated by CAD/MI Continues allergy vaccine without problems. Bronchitis this winter required 2 rounds of Augmentin. Shortness of breath and cough have resolved. Restarted on amoxicillin March 8 for rhinitis with no sinus pain.  10/25/11-73 year old female former smoker followed for allergic rhinitis, COPD, complicated by CAD/MI  Still on vaccine 1:10 GO and doing well;has started using Zyrtec and its helping. Denies any SOB, wheezing, cough, or congestion at this time. Mild postnasal drip, not worth treating differently COPD assessment test (CABG) scored 20/40  05/25/12-73 year old female former smoker followed for allergic rhinitis, COPD, complicated by CAD/MI  Still on vaccine 1:10 GO and doing well; FOLLOWS FOR: Breathing is unchanged. Reports dry cough that sounds like the "croup," SOB and wheezing at times. Denies chest pain or chest tightness. Zyrtec is still helping manage allergy symptoms. Likes Dymista nasal spray and still using Zyrtec occasionally if needed. Intermittent dry cough without voice change, dysphagia, wheeze or reflux.  11/24/12- 73 year old female former smoker followed for allergic rhinitis, COPD/ chronic bronchitis, complicated by CAD/MI  Still on Allergy vaccine 1:10 GO and doing well; Follows For: Denies sob or wheezing,  cough, or chest discomfort - Occas runny nose Discussed hi dose flu vax [place] [place] we discussed transfer of records there when she establishes physician.  CXR 05/25/12- IMPRESSION:  COPD/chronic bronchitis, mild. No acute superimposed process.  Original Report Authenticated By: Jeronimo GreavesKyle Talbot, M.D.  05/25/13- 73 year old female former smoker followed for allergic rhinitis, COPD/ chronic bronchitis, complicated by CAD/MI  FOLLOWS FOR: still on allergy vaccine 1:10 GO-doing well and no reactions. PCP-Dr Earlene PlaterJohn Kihm [place], [place] Denies acute concerns or questions  Review of Systems-see HPI Constitutional:   No-   weight loss, night sweats, fevers, chills, fatigue, lassitude. HEENT:   No-  headaches, difficulty swallowing, tooth/dental problems, sore throat,       No-  sneezing, itching, ear ache,  +nasal congestion, post nasal drip,  CV:  No-   chest pain, orthopnea, PND, swelling in lower extremities, anasarca, dizziness, palpitations Resp: No- acute  shortness of breath with exertion or at rest.              No-   productive cough,  No- non-productive cough,  No-  coughing up of blood.              No-   change in color of mucus.  No- wheezing.   Skin: No-   rash or lesions. GI:  No-   heartburn, indigestion, abdominal pain, nausea, vomiting,  GU:  MS:  No-   joint pain or swelling. . Neuro- nothing unusual  Psych:  No- change in mood or affect. No depression or anxiety.  No memory loss.   Objective:   Physical Exam General- Alert, Oriented, Affect-appropriate, Distress- none acute  overweight Skin- rash-none, lesions- none, excoriation- none  Lymphadenopathy- none Head- atraumatic            Eyes- Gross vision intact, PERRLA, conjunctivae clear secretions            Ears- Hearing, canals- normal            Nose- Clear, no- Septal dev, mucus, polyps, erosion, perforation             Throat- Mallampati II , mucosa clear , drainage- none, tonsils- atrophic, own teeth Neck-  flexible , trachea midline, no stridor , thyroid nl, carotid no bruit Chest - symmetrical excursion , unlabored           Heart/CV- RRR , no murmur , no gallop  , no rub, nl s1 s2                           - JVD- none , edema- none, stasis changes- none, varices- none           Lung- clear to P&A, wheeze-none, cough- none , dullness-none, rub- none           Chest wall-  Abd-  Br/ Gen/ Rectal- Not done, not indicated Extrem- cyanosis- none, clubbing, none, atrophy- none, strength- nl Neuro- grossly intact to observation

## 2013-06-24 NOTE — Assessment & Plan Note (Signed)
Controlled Plan-refill albuterol rescue inhaler and EpiPen

## 2013-06-24 NOTE — Assessment & Plan Note (Signed)
Plan-continue allergy vaccine, refill EpiPen

## 2013-08-30 DIAGNOSIS — M1990 Unspecified osteoarthritis, unspecified site: Secondary | ICD-10-CM

## 2013-08-30 DIAGNOSIS — M47812 Spondylosis without myelopathy or radiculopathy, cervical region: Secondary | ICD-10-CM | POA: Insufficient documentation

## 2013-08-30 HISTORY — DX: Unspecified osteoarthritis, unspecified site: M19.90

## 2013-08-30 HISTORY — DX: Spondylosis without myelopathy or radiculopathy, cervical region: M47.812

## 2013-08-31 ENCOUNTER — Ambulatory Visit (INDEPENDENT_AMBULATORY_CARE_PROVIDER_SITE_OTHER): Payer: Medicare Other

## 2013-08-31 DIAGNOSIS — J309 Allergic rhinitis, unspecified: Secondary | ICD-10-CM

## 2013-10-04 ENCOUNTER — Encounter: Payer: Self-pay | Admitting: Internal Medicine

## 2013-10-04 ENCOUNTER — Ambulatory Visit: Payer: Medicare Other | Admitting: Internal Medicine

## 2013-10-04 VITALS — BP 126/60 | HR 79 | Ht 62.75 in | Wt 213.2 lb

## 2013-10-04 DIAGNOSIS — J449 Chronic obstructive pulmonary disease, unspecified: Secondary | ICD-10-CM

## 2013-10-04 NOTE — Patient Instructions (Signed)
We can continue allergy vaccine 1:10 GO  Ok to continue Flonase now, and to add an otc antihistamine like claritin, zyrtec or allegra for the watery nose

## 2013-10-04 NOTE — Progress Notes (Signed)
Patient ID: [name], female    DOB: 30-Dec-1940, 73 y.o.   MRN: [id number]  HPI 10/22/10- 73 year old female former smoker followed for allergic rhinitis, COPD, complicated by CAD/MI Last here March 13,2012- has been in hosp twice for MI by enzymes- had MI- [place] then [facility] after cardiac cath with stent. Cortisone shot for sore left shoulder yesterday.  Allergy - no problem with fall season so far. Continues allergy shots with no problem.  Denies limiting SOB, cough or wheeze now.  She brings an extremely long list of meds, supplements and etc.   04/22/11-  73 year old female former smoker followed for allergic rhinitis, COPD, complicated by CAD/MI Continues allergy vaccine without problems. Bronchitis this winter required 2 rounds of Augmentin. Shortness of breath and cough have resolved. Restarted on amoxicillin March 8 for rhinitis with no sinus pain.  10/25/11-73 year old female former smoker followed for allergic rhinitis, COPD, complicated by CAD/MI  Still on vaccine 1:10 GO and doing well;has started using Zyrtec and its helping. Denies any SOB, wheezing, cough, or congestion at this time. Mild postnasal drip, not worth treating differently COPD assessment test (CABG) scored 20/40  05/25/12-73 year old female former smoker followed for allergic rhinitis, COPD, complicated by CAD/MI  Still on vaccine 1:10 GO and doing well; FOLLOWS FOR: Breathing is unchanged. Reports dry cough that sounds like the "croup," SOB and wheezing at times. Denies chest pain or chest tightness. Zyrtec is still helping manage allergy symptoms. Likes Dymista nasal spray and still using Zyrtec occasionally if needed. Intermittent dry cough without voice change, dysphagia, wheeze or reflux.  11/24/12- 73 year old female former smoker followed for allergic rhinitis, COPD/ chronic bronchitis, complicated by CAD/MI  Still on Allergy vaccine 1:10 GO and doing well; Follows For: Denies sob or wheezing,  cough, or chest discomfort - Occas runny nose Discussed hi dose flu vax Moved back to [facility] so we discussed transfer of records there when she establishes physician.  CXR 05/25/12- IMPRESSION:  COPD/chronic bronchitis, mild. No acute superimposed process.  Original Report Authenticated By: Jeronimo Greaves, M.D.  05/25/13- 73 year old female former smoker followed for allergic rhinitis, COPD/ chronic bronchitis, complicated by CAD/MI  FOLLOWS FOR: still on allergy vaccine 1:10 GO-doing well and no reactions. PCP-Dr Earlene Plater, [place] Denies acute concerns or questions  10/04/13- 73 year old female former smoker followed for allergic rhinitis, COPD/ chronic bronchitis, complicated by CAD/MI  FOLLOWS MVH:QIONGEX vaccine 1:10 GO -doing good,gaining wt.,sob same,dry cough, no wheezing, runny nose like water-clear,pnd   Review of Systems-see HPI Constitutional:   No-   weight loss, night sweats, fevers, chills, fatigue, lassitude. HEENT:   No-  headaches, difficulty swallowing, tooth/dental problems, sore throat,       No-  sneezing, itching, ear ache,  +nasal congestion, post nasal drip,  CV:  No-   chest pain, orthopnea, PND, swelling in lower extremities, anasarca, dizziness, palpitations Resp: No- acute  shortness of breath with exertion or at rest.              No-   productive cough,  No- non-productive cough,  No-  coughing up of blood.              No-   change in color of mucus.  No- wheezing.   Skin: No-   rash or lesions. GI:  No-   heartburn, indigestion, abdominal pain, nausea, vomiting,  GU:  MS:  No-   joint pain or swelling. . Neuro- nothing unusual  Psych:  No- change in mood  or affect. No depression or anxiety.  No memory loss.   Objective:   Physical Exam General- Alert, Oriented, Affect-appropriate, Distress- none acute  overweight Skin- rash-none, lesions- none, excoriation- none     Lymphadenopathy- none Head- atraumatic            Eyes- Gross vision intact, PERRLA,  conjunctivae clear secretions            Ears- Hearing, canals- normal            Nose- Clear, no- Septal dev, mucus, polyps, erosion, perforation             Throat- Mallampati II , mucosa clear , drainage- none, tonsils- atrophic, own teeth Neck- flexible , trachea midline, no stridor , thyroid nl, carotid no bruit Chest - symmetrical excursion , unlabored           Heart/CV- RRR , no murmur , no gallop  , no rub, nl s1 s2                           - JVD- none , edema- none, stasis changes- none, varices- none           Lung- clear to P&A, wheeze-none, cough- none , dullness-none, rub- none           Chest wall-  Abd-  Br/ Gen/ Rectal- Not done, not indicated Extrem- cyanosis- none, clubbing, none, atrophy- none, strength- nl Neuro- grossly intact to observation

## 2013-12-21 ENCOUNTER — Encounter: Payer: Self-pay | Admitting: Internal Medicine

## 2013-12-28 ENCOUNTER — Ambulatory Visit (INDEPENDENT_AMBULATORY_CARE_PROVIDER_SITE_OTHER): Payer: Medicare Other

## 2013-12-28 DIAGNOSIS — J309 Allergic rhinitis, unspecified: Secondary | ICD-10-CM

## 2014-04-01 ENCOUNTER — Telehealth: Payer: Self-pay | Admitting: Internal Medicine

## 2014-04-01 NOTE — Telephone Encounter (Signed)
Letter has been typed and placed on CY's desk to be signed.  Will be placed in the mail as it is signed.

## 2014-04-01 NOTE — Telephone Encounter (Signed)
Ok to send note that she is self-administering allergy vaccine prescribed by us.

## 2014-04-01 NOTE — Telephone Encounter (Signed)
Spoke with pt, states that where she now lives ([facility]) is requiring a letter stating that she is a [facility] pt who is being treated for allergies and takes weekly allergy injections (2 injections) that are self administered.  Letter needs to be sent to : [place] S[address] [place] [address]  [address] [place] [id number]   CY are you ok with us writing this letter on your behalf?  Thanks!

## 2014-04-02 ENCOUNTER — Ambulatory Visit (INDEPENDENT_AMBULATORY_CARE_PROVIDER_SITE_OTHER): Payer: Medicare Other

## 2014-04-02 DIAGNOSIS — J309 Allergic rhinitis, unspecified: Secondary | ICD-10-CM

## 2014-04-11 ENCOUNTER — Ambulatory Visit: Payer: Medicare Other | Admitting: Internal Medicine

## 2014-05-02 ENCOUNTER — Encounter: Payer: Self-pay | Admitting: Internal Medicine

## 2014-05-02 ENCOUNTER — Ambulatory Visit (INDEPENDENT_AMBULATORY_CARE_PROVIDER_SITE_OTHER): Payer: Medicare Other | Admitting: Internal Medicine

## 2014-05-02 ENCOUNTER — Ambulatory Visit (INDEPENDENT_AMBULATORY_CARE_PROVIDER_SITE_OTHER)
Admission: RE | Admit: 2014-05-02 | Discharge: 2014-05-02 | Disposition: A | Discharge status: Home / Self Care | Payer: Medicare Other | Source: Ambulatory Visit | Attending: Internal Medicine | Admitting: Internal Medicine

## 2014-05-02 VITALS — BP 130/70 | HR 76 | Ht 62.75 in | Wt 217.0 lb

## 2014-05-02 DIAGNOSIS — J3089 Other allergic rhinitis: Secondary | ICD-10-CM

## 2014-05-02 DIAGNOSIS — J309 Allergic rhinitis, unspecified: Secondary | ICD-10-CM | POA: Diagnosis not present

## 2014-05-02 DIAGNOSIS — J302 Other seasonal allergic rhinitis: Secondary | ICD-10-CM

## 2014-05-02 DIAGNOSIS — IMO0001 Reserved for inherently not codable concepts without codable children: Secondary | ICD-10-CM

## 2014-05-02 DIAGNOSIS — J449 Chronic obstructive pulmonary disease, unspecified: Secondary | ICD-10-CM

## 2014-05-02 MED ORDER — LEVALBUTEROL HCL 0.63 MG/3ML IN NEBU
0.6300 mg | INHALATION_SOLUTION | Freq: Once | RESPIRATORY_TRACT | Status: AC
  Administered 2014-05-02: 0.63 mg via RESPIRATORY_TRACT

## 2014-05-02 MED ORDER — AMOXICILLIN 500 MG PO CAPS: ORAL_CAPSULE | ORAL | Status: DC

## 2014-05-02 MED ORDER — METHYLPREDNISOLONE ACETATE 80 MG/ML IJ SUSP
120.0000 mg | Freq: Once | INTRAMUSCULAR | Status: AC
  Administered 2014-05-02: 120 mg via INTRAMUSCULAR

## 2014-05-02 NOTE — Progress Notes (Signed)
Patient ID: [name], female    DOB: 06/20/1940, 74 y.o.   MRN: [id number]  HPI 10/22/10- 74 year old female former smoker followed for allergic rhinitis, COPD, complicated by CAD/MI Last here March 13,2012- has been in hosp twice for MI by enzymes- [facility]- [place] [facility] with [facility]. Cortisone shot for sore left shoulder yesterday.  Allergy - no problem with fall season so far. Continues allergy shots with no problem.  Denies limiting SOB, cough or wheeze now.  She brings an extremely long list of meds, supplements and etc.   04/22/11-  74 year old female former smoker followed for allergic rhinitis, COPD, complicated by CAD/MI Continues allergy vaccine without problems. Bronchitis this winter required 2 rounds of Augmentin. Shortness of breath and cough have resolved. Restarted on amoxicillin March 8 for rhinitis with no sinus pain.  10/25/11-74 year old female former smoker followed for allergic rhinitis, COPD, complicated by CAD/MI  Still on vaccine 1:10 GO and doing well;has started using Zyrtec and its helping. Denies any SOB, wheezing, cough, or congestion at this time. Mild postnasal drip, not worth treating differently COPD assessment test (CABG) scored 20/40  05/25/12-74 year old female former smoker followed for allergic rhinitis, COPD, complicated by CAD/MI  Still on vaccine 1:10 GO and doing well; FOLLOWS FOR: Breathing is unchanged. Reports dry cough that sounds like the "croup," SOB and wheezing at times. Denies chest pain or chest tightness. Zyrtec is still helping manage allergy symptoms. Likes Dymista nasal spray and still using Zyrtec occasionally if needed. Intermittent dry cough without voice change, dysphagia, wheeze or reflux.  11/24/12- 74 year old female former smoker followed for allergic rhinitis, COPD/ chronic bronchitis, complicated by CAD/MI  Still on Allergy vaccine 1:10 GO and doing well; Follows For: Denies sob or wheezing,  cough, or chest discomfort - Occas runny nose Discussed hi dose flu vax [facility] so we discussed transfer of records there when she establishes physician.  CXR 05/25/12- IMPRESSION:  COPD/chronic bronchitis, mild. No acute superimposed process.  Original Report Authenticated By: Jeronimo GreavesKyle Talbot, M.D.  05/25/13- 74 year old female former smoker followed for allergic rhinitis, COPD/ chronic bronchitis, complicated by CAD/MI  FOLLOWS FOR: still on allergy vaccine 1:10 GO-doing well and no reactions. PCP-Dr Earlene PlaterJohn Kihm [place], [place] Denies acute concerns or questions  10/04/13- 74 year old female former smoker followed for allergic rhinitis, COPD/ chronic bronchitis, complicated by CAD/MI  FOLLOWS UXL:[id number]:Allergy vaccine 1:10 GO -doing good,gaining wt.,sob same,dry cough, no wheezing, runny nose like water-clear,pnd  05/02/14- 74 year old female former smoker followed for allergic rhinitis, COPD/ chronic bronchitis, complicated by CAD/MI  FOLLOWS FOR: Allergy Vaccine 1:10 GO  working well.  Chest feels tight today with some dry cough. Doing a little better after primary physician gave Zithromax in late February but also uses "rock and rye" for cough  Review of Systems-see HPI Constitutional:   No-   weight loss, night sweats, fevers, chills, fatigue, lassitude. HEENT:   No-  headaches, difficulty swallowing, tooth/dental problems, sore throat,       No-  sneezing, itching, ear ache,  +nasal congestion, post nasal drip,  CV:  No-   chest pain, orthopnea, PND, swelling in lower extremities, anasarca, dizziness, palpitations Resp: No- acute  shortness of breath with exertion or at rest.              No-   productive cough,  + non-productive cough,  No-  coughing up of blood.  No-   change in color of mucus.  No- wheezing.   Skin: No-   rash or lesions. GI:  No-   heartburn, indigestion, abdominal pain, nausea, vomiting,  GU:  MS:  No-   joint pain or swelling. . Neuro- nothing unusual   Psych:  No- change in mood or affect. No depression or anxiety.  No memory loss.   Objective:   Physical Exam General- Alert, Oriented, Affect-appropriate, Distress- none acute , +overweight Skin- rash-none, lesions- none, excoriation- none     Lymphadenopathy- none Head- atraumatic            Eyes- Gross vision intact, PERRLA, conjunctivae clear secretions            Ears- Hearing, canals- normal            Nose- Clear, no- Septal dev, mucus, polyps, erosion, perforation             Throat- Mallampati III , mucosa clear , drainage- none, tonsils- atrophic, own teeth Neck- flexible , trachea midline, no stridor , thyroid nl, carotid no bruit Chest - symmetrical excursion , unlabored           Heart/CV- RRR , no murmur , no gallop  , no rub, nl s1 s2                           - JVD- none , edema- none, stasis changes- none, varices- none           Lung- clear to P&A, wheeze-none, cough- none , dullness-none, rub- none           Chest wall-  Abd-  Br/ Gen/ Rectal- Not done, not indicated Extrem- cyanosis- none, clubbing, none, atrophy- none, strength- nl Neuro- grossly intact to observation

## 2014-05-02 NOTE — Patient Instructions (Signed)
Script sent for amoxacillin  Neb xop 0.63  Depo 80  CXR   Dx acute exacerbation of COPD  You can also use otc Delsym cough syrup

## 2014-05-05 NOTE — Assessment & Plan Note (Signed)
She continues allergy vaccine is discussed reporting no problems

## 2014-05-05 NOTE — Assessment & Plan Note (Signed)
Minor recent exacerbation may have started as a cold in late February. Exam is unremarkable today Plan-chest x-ray. Medication discussion.

## 2014-05-07 ENCOUNTER — Telehealth: Payer: Self-pay | Admitting: Internal Medicine

## 2014-05-07 MED ORDER — BENZONATATE 200 MG PO CAPS: 200.0000 mg | ORAL_CAPSULE | Freq: Three times a day (TID) | ORAL | Status: AC | PRN

## 2014-05-07 NOTE — Progress Notes (Signed)
Quick Note:  lmtcb for pt. ______ 

## 2014-05-07 NOTE — Telephone Encounter (Signed)
Pt advised of cxr results per Dr young.  Pt states that she found an old rx for [employer]on Perles and took some and this seemed to help.  Would like new rx sent to [name].  Please advise if ok to send rx.

## 2014-05-07 NOTE — Telephone Encounter (Signed)
Offer Tessalon perles 200 mg, # 30, 1 every 8 hours if needed for cough     Ref x 5

## 2014-05-07 NOTE — Telephone Encounter (Signed)
Rx has been sent in per CY. Pt is aware. Nothing further was needed. 

## 2014-08-30 ENCOUNTER — Ambulatory Visit (INDEPENDENT_AMBULATORY_CARE_PROVIDER_SITE_OTHER): Payer: Medicare Other

## 2014-08-30 DIAGNOSIS — J309 Allergic rhinitis, unspecified: Secondary | ICD-10-CM

## 2014-10-15 ENCOUNTER — Encounter: Payer: Self-pay | Admitting: Internal Medicine

## 2014-10-15 ENCOUNTER — Ambulatory Visit (INDEPENDENT_AMBULATORY_CARE_PROVIDER_SITE_OTHER): Payer: Medicare Other | Admitting: Internal Medicine

## 2014-10-15 VITALS — BP 118/68 | HR 70 | Ht 62.0 in | Wt 216.0 lb

## 2014-10-15 DIAGNOSIS — I2510 Atherosclerotic heart disease of native coronary artery without angina pectoris: Secondary | ICD-10-CM | POA: Insufficient documentation

## 2014-10-15 DIAGNOSIS — J309 Allergic rhinitis, unspecified: Secondary | ICD-10-CM

## 2014-10-15 DIAGNOSIS — J449 Chronic obstructive pulmonary disease, unspecified: Secondary | ICD-10-CM | POA: Diagnosis not present

## 2014-10-15 DIAGNOSIS — J3089 Other allergic rhinitis: Secondary | ICD-10-CM

## 2014-10-15 DIAGNOSIS — Z23 Encounter for immunization: Secondary | ICD-10-CM | POA: Diagnosis not present

## 2014-10-15 DIAGNOSIS — J302 Other seasonal allergic rhinitis: Secondary | ICD-10-CM

## 2014-10-15 DIAGNOSIS — I25118 Atherosclerotic heart disease of native coronary artery with other forms of angina pectoris: Secondary | ICD-10-CM

## 2014-10-15 DIAGNOSIS — J410 Simple chronic bronchitis: Secondary | ICD-10-CM

## 2014-10-15 DIAGNOSIS — IMO0001 Reserved for inherently not codable concepts without codable children: Secondary | ICD-10-CM

## 2014-10-15 HISTORY — DX: Atherosclerotic heart disease of native coronary artery without angina pectoris: I25.10

## 2014-10-15 NOTE — Progress Notes (Signed)
Patient ID: [name], female    DOB: June 06, 1940, 74 y.o.   MRN: [id number]  HPI 10/22/10- 74 year old female former smoker followed for allergic rhinitis, COPD, complicated by CAD/MI Last here March 13,2012- has been in hosp twice for MI by enzymes- had MI- [place] then [facility] after cardiac cath with stent. Cortisone shot for sore left shoulder yesterday.  Allergy - no problem with fall season so far. Continues allergy shots with no problem.  Denies limiting SOB, cough or wheeze now.  She brings an extremely long list of meds, supplements and etc.   04/22/11-  74 year old female former smoker followed for allergic rhinitis, COPD, complicated by CAD/MI Continues allergy vaccine without problems. Bronchitis this winter required 2 rounds of Augmentin. Shortness of breath and cough have resolved. Restarted on amoxicillin March 8 for rhinitis with no sinus pain.  10/25/11-74 year old female former smoker followed for allergic rhinitis, COPD, complicated by CAD/MI  Still on vaccine 1:10 GO and doing well;has started using Zyrtec and its helping. Denies any SOB, wheezing, cough, or congestion at this time. Mild postnasal drip, not worth treating differently COPD assessment test (CABG) scored 20/40  05/25/12-74 year old female former smoker followed for allergic rhinitis, COPD, complicated by CAD/MI  Still on vaccine 1:10 GO and doing well; FOLLOWS FOR: Breathing is unchanged. Reports dry cough that sounds like the "croup," SOB and wheezing at times. Denies chest pain or chest tightness. Zyrtec is still helping manage allergy symptoms. Likes Dymista nasal spray and still using Zyrtec occasionally if needed. Intermittent dry cough without voice change, dysphagia, wheeze or reflux.  11/24/12- 74 year old female former smoker followed for allergic rhinitis, COPD/ chronic bronchitis, complicated by CAD/MI  Still on Allergy vaccine 1:10 GO and doing well; Follows For: Denies sob or wheezing,  cough, or chest discomfort - Occas runny nose Discussed hi dose flu vax Moved back [facility] so we discussed transfer of records there when she establishes physician.  CXR 05/25/12- IMPRESSION:  COPD/chronic bronchitis, mild. No acute superimposed process.  Original Report Authenticated By: Jeronimo Greaves, M.D.  05/25/13- 74 year old female former smoker followed for allergic rhinitis, COPD/ chronic bronchitis, complicated by CAD/MI  FOLLOWS FOR: still on allergy vaccine 1:10 GO-doing well and no reactions. PCP-Dr Earlene Plater, [place] Denies acute concerns or questions  10/04/13- 74 year old female former smoker followed for allergic rhinitis, COPD/ chronic bronchitis, complicated by CAD/MI  FOLLOWS WUJ:[id number] vaccine 1:10 GO -doing good,gaining wt.,sob same,dry cough, no wheezing, runny nose like water-clear,pnd  05/02/14- 74 year old female former smoker followed for allergic rhinitis, COPD/ chronic bronchitis, complicated by CAD/MI  FOLLOWS FOR: Allergy Vaccine 1:10 GO  working well.  Chest feels tight today with some dry cough. Doing a little better after primary physician gave Zithromax in late February but also uses "rock and rye" for cough  10/15/14-74 year old female former smoker followed for allergic rhinitis, chronic bronchitis, complicated by CAD/MI/ stent  Allergy Vaccine 1:10 GO   Follow For: pt states she is taking  Vaccine split A&B vials weekly and tolerating well and feelds they are working great for her, shes been doing pretty good.  No acute respiratory concerns and denies cough or wheeze. CAD-new stent in August. Occasional angina We reviewed her chest x-ray. Prominent markings in the bases, especially left lower lobe CXR 05/02/14 IMPRESSION: COPD changes with LEFT basilar atelectasis. Electronically Signed  By: Ulyses Southward M.D.  On: 05/02/2014 17:35  Office Spirometry 10/15/2014-within normal limits. FEV1 1.7/93%, FVC 2.5/97%, FEV1/FVC 0.71, FEF 25-75 percent  1.1/76%.  Review  of Systems-see HPI Constitutional:   No-   weight loss, night sweats, fevers, chills, fatigue, lassitude. HEENT:   No-  headaches, difficulty swallowing, tooth/dental problems, sore throat,       No-  sneezing, itching, ear ache,  nasal congestion, post nasal drip,  CV:  No-   chest pain, orthopnea, PND, swelling in lower extremities, anasarca, dizziness, palpitations Resp: No- acute  shortness of breath with exertion or at rest.              No-   productive cough,   non-productive cough,  No-  coughing up of blood.              No-   change in color of mucus.  No- wheezing.   Skin: No-   rash or lesions. GI:  No-   heartburn, indigestion, abdominal pain, nausea, vomiting,  GU:  MS:  No-   joint pain or swelling. . Neuro- nothing unusual  Psych:  No- change in mood or affect. No depression or anxiety.  No memory loss.   Objective:   Physical Exam General- Alert, Oriented, Affect-appropriate, Distress- none acute , +overweight Skin- rash-none, lesions- none, excoriation- none     Lymphadenopathy- none Head- atraumatic            Eyes- Gross vision intact, PERRLA, conjunctivae clear secretions            Ears- Hearing, canals- normal            Nose- Clear, no- Septal dev, mucus, polyps, erosion, perforation             Throat- Mallampati III , mucosa clear , drainage- none, tonsils- atrophic, own teeth Neck- flexible , trachea midline, no stridor , thyroid nl, carotid no bruit Chest - symmetrical excursion , unlabored           Heart/CV- RRR , no murmur , no gallop  , no rub, nl s1 s2                           - JVD- none , edema- none, stasis changes- none, varices- none           Lung- clear to P&A, wheeze-none, cough- none , dullness-none, rub- none           Chest wall-  Abd-  Br/ Gen/ Rectal- Not done, not indicated Extrem- cyanosis- none, clubbing, none, atrophy- none, strength- nl Neuro- grossly intact to observation

## 2014-10-15 NOTE — Patient Instructions (Signed)
Flu vax  Office spirometry    Dx COPD with chronic bronchitis  We can continue allergy vaccine 1:10 GO

## 2014-10-15 NOTE — Assessment & Plan Note (Signed)
History of MI and stent with occasional angina managed by her cardiologist

## 2014-10-15 NOTE — Assessment & Plan Note (Signed)
She is quite sure that allergy vaccine remains a good idea for her. She splits her vials with injection and a separate arms to avoid local reaction.

## 2014-10-15 NOTE — Assessment & Plan Note (Addendum)
Office spirometry does not suggest significant obstructive airway component at this time. Chest x-ray suggests chronic scarring changes lower lung zones. Symptom control now is good. Plan-flu vaccine, office spirometry

## 2014-10-16 ENCOUNTER — Telehealth: Payer: Self-pay | Admitting: Internal Medicine

## 2014-10-16 NOTE — Telephone Encounter (Signed)
Returned pt's call and reviewed results and recs per CY  Notes Recorded by Waymon Budge, MD on 10/15/2014 at 2:19 PM Office spirometry- air flow was normal, without significant evidence of COPD today by this kind of measure  Pt voiced understanding and had no further questions.  Nothing further needed

## 2014-10-16 NOTE — Progress Notes (Signed)
Quick Note:  Called and spoke with pt. Reviewed results and recs. Pt voiced understanding and had no further questions. ______ 

## 2014-10-23 ENCOUNTER — Encounter: Payer: Self-pay | Admitting: Internal Medicine

## 2014-10-25 DIAGNOSIS — Z955 Presence of coronary angioplasty implant and graft: Secondary | ICD-10-CM

## 2014-10-25 HISTORY — DX: Presence of coronary angioplasty implant and graft: Z95.5

## 2015-01-10 ENCOUNTER — Ambulatory Visit (INDEPENDENT_AMBULATORY_CARE_PROVIDER_SITE_OTHER): Payer: Medicare Other

## 2015-01-10 ENCOUNTER — Telehealth: Payer: Self-pay | Admitting: Internal Medicine

## 2015-01-10 DIAGNOSIS — J309 Allergic rhinitis, unspecified: Secondary | ICD-10-CM

## 2015-01-10 NOTE — Telephone Encounter (Signed)
Allergy Serum Extract Date Mixed: 01/10/15 Vial: 2 Strength: 1:10 Here/Mail/Pick Up: mail Mixed By: tbs Last OV: 10/15/14 Pending OV: 04/15/15

## 2015-04-14 ENCOUNTER — Ambulatory Visit: Payer: Medicare Other | Admitting: Internal Medicine

## 2015-04-15 ENCOUNTER — Encounter: Payer: Self-pay | Admitting: Internal Medicine

## 2015-04-15 ENCOUNTER — Ambulatory Visit (INDEPENDENT_AMBULATORY_CARE_PROVIDER_SITE_OTHER): Payer: Medicare Other | Admitting: Internal Medicine

## 2015-04-15 VITALS — BP 120/68 | HR 64 | Ht 62.0 in | Wt 193.4 lb

## 2015-04-15 DIAGNOSIS — J309 Allergic rhinitis, unspecified: Secondary | ICD-10-CM | POA: Diagnosis not present

## 2015-04-15 DIAGNOSIS — J410 Simple chronic bronchitis: Secondary | ICD-10-CM

## 2015-04-15 DIAGNOSIS — J3089 Other allergic rhinitis: Principal | ICD-10-CM

## 2015-04-15 DIAGNOSIS — J302 Other seasonal allergic rhinitis: Secondary | ICD-10-CM

## 2015-04-15 MED ORDER — ALBUTEROL SULFATE HFA 108 (90 BASE) MCG/ACT IN AERS: 2.0000 | INHALATION_SPRAY | Freq: Four times a day (QID) | RESPIRATORY_TRACT | Status: AC | PRN

## 2015-04-15 NOTE — Assessment & Plan Note (Signed)
Since I will be retiring in a year and we will be stopping the allergy program at this office, I suggested she talk with her PCP about local referral in [place].. For now she is comfortable and feels well controlled.

## 2015-04-15 NOTE — Progress Notes (Signed)
Patient ID: [name], female    DOB: 10/17/1940, 75 y.o.   MRN: [id number]  HPI 10/22/10- 75 year old female former smoker followed for allergic rhinitis, COPD, complicated by CAD/MI Last here March 13,2012- has been in hosp twice for MI by enzymes- [facility] [place] [facility]. Cortisone shot for sore left shoulder yesterday.  Allergy - no problem with fall season so far. Continues allergy shots with no problem.  Denies limiting SOB, cough or wheeze now.  She brings an extremely long list of meds, supplements and etc.   04/22/11-  75 year old female former smoker followed for allergic rhinitis, COPD, complicated by CAD/MI Continues allergy vaccine without problems. Bronchitis this winter required 2 rounds of Augmentin. Shortness of breath and cough have resolved. Restarted on amoxicillin March 8 for rhinitis with no sinus pain.  10/25/11-75 year old female former smoker followed for allergic rhinitis, COPD, complicated by CAD/MI  Still on vaccine 1:10 GO and doing well;has started using Zyrtec and its helping. Denies any SOB, wheezing, cough, or congestion at this time. Mild postnasal drip, not worth treating differently COPD assessment test (CABG) scored 20/40  05/25/12-75 year old female former smoker followed for allergic rhinitis, COPD, complicated by CAD/MI  Still on vaccine 1:10 GO and doing well; FOLLOWS FOR: Breathing is unchanged. Reports dry cough that sounds like the "croup," SOB and wheezing at times. Denies chest pain or chest tightness. Zyrtec is still helping manage allergy symptoms. Likes Dymista nasal spray and still using Zyrtec occasionally if needed. Intermittent dry cough without voice change, dysphagia, wheeze or reflux.  11/24/12- 75 year old female former smoker followed for allergic rhinitis, COPD/ chronic bronchitis, complicated by CAD/MI  Still on Allergy vaccine 1:10 GO and doing well; Follows For: Denies sob or wheezing,  cough, or chest discomfort - Occas runny nose Discussed hi dose flu vax [place] [facility] [place] we discussed transfer of records there when she establishes physician.  CXR 05/25/12- IMPRESSION:  COPD/chronic bronchitis, mild. No acute superimposed process.  Original Report Authenticated By: Jeronimo GreavesKyle Talbot, M.D.  05/25/13- 75 year old female former smoker followed for allergic rhinitis, COPD/ chronic bronchitis, complicated by CAD/MI  FOLLOWS FOR: still on allergy vaccine 1:10 GO-doing well and no reactions. PCP-Dr Earlene PlaterJohn Kihm [place], [place] Denies acute concerns or questions  10/04/13- 75 year old female former smoker followed for allergic rhinitis, COPD/ chronic bronchitis, complicated by CAD/MI  FOLLOWS NWG:NFAOZHYFOR:Allergy vaccine 1:10 GO -doing good,gaining wt.,sob same,dry cough, no wheezing, runny nose like water-clear,pnd  05/02/14- 75 year old female former smoker followed for allergic rhinitis, COPD/ chronic bronchitis, complicated by CAD/MI  FOLLOWS FOR: Allergy Vaccine 1:10 GO  working well.  Chest feels tight today with some dry cough. Doing a little better after primary physician gave Zithromax in late February but also uses "rock and rye" for cough  10/15/14-75 year old female former smoker followed for allergic rhinitis, chronic bronchitis, complicated by CAD/MI/ stent  Allergy Vaccine 1:10 GO   Follow For: pt states she is taking  Vaccine split A&B vials weekly and tolerating well and feelds they are working great for her, shes been doing pretty good.  No acute respiratory concerns and denies cough or wheeze. CAD-new stent in August. Occasional angina We reviewed her chest x-ray. Prominent markings in the bases, especially left lower lobe CXR 05/02/14 IMPRESSION: COPD changes with LEFT basilar atelectasis. Electronically Signed  By: Ulyses SouthwardMark Boles M.D.  On: 05/02/2014 17:35  Office Spirometry 10/15/2014-within normal limits. FEV1 1.7/93%, FVC 2.5/97%, FEV1/FVC 0.71, FEF 25-75 percent  1.1/76%.  04/15/2015-75 year old  female former smoker followed for allergic rhinitis, chronic bronchitis, complicated by CAD/MI/stent Allergy Vaccine 1:10 GO FOLLOWS FOR: Pt states her breathing has been doing okay but feels slight wheezing and tightness in chest. Denies any colds or flareups. Infrequent need for rescue inhaler but asks refill.  Review of Systems-see HPI Constitutional:   No-   weight loss, night sweats, fevers, chills, fatigue, lassitude. HEENT:   No-  headaches, difficulty swallowing, tooth/dental problems, sore throat,       No-  sneezing, itching, ear ache,  nasal congestion, post nasal drip,  CV:  No-   chest pain, orthopnea, PND, swelling in lower extremities, anasarca, dizziness, palpitations Resp: No- acute  shortness of breath with exertion or at rest.              No-   productive cough,   non-productive cough,  No-  coughing up of blood.              No-   change in color of mucus.  No- wheezing.   Skin: No-   rash or lesions. GI:  No-   heartburn, indigestion, abdominal pain, nausea, vomiting,  GU:  MS:  No-   joint pain or swelling. . Neuro- nothing unusual  Psych:  No- change in mood or affect. No depression or anxiety.  No memory loss.   Objective:   Physical Exam General- Alert, Oriented, Affect-appropriate, Distress- none acute , +overweight Skin- rash-none, lesions- none, excoriation- none     Lymphadenopathy- none Head- atraumatic            Eyes- Gross vision intact, PERRLA, conjunctivae clear secretions            Ears- Hearing, canals- normal            Nose- Clear, no- Septal dev, mucus, polyps, erosion, perforation             Throat- Mallampati III , mucosa clear , drainage- none, tonsils- atrophic, own teeth Neck- flexible , trachea midline, no stridor , thyroid nl, carotid no bruit Chest - symmetrical excursion , unlabored           Heart/CV- RRR , no murmur , no gallop  , no rub, nl s1 s2                           - JVD- none , edema-  none, stasis changes- none, varices- none           Lung- clear to P&A, wheeze-none, cough- none , dullness-none, rub- none           Chest wall-  Abd-  Br/ Gen/ Rectal- Not done, not indicated Extrem- cyanosis- none, clubbing, none, atrophy- none, strength- nl Neuro- grossly intact to observation

## 2015-04-15 NOTE — Assessment & Plan Note (Signed)
Mild intermittent uncomplicated. Rescue inhaler used about once per month.

## 2015-04-15 NOTE — Patient Instructions (Addendum)
We can continue allergy vaccine this year. As discussed, you might ask Dr Candace GallusKihm to recommend an allergist to help with those problems.

## 2015-05-12 ENCOUNTER — Telehealth: Payer: Self-pay | Admitting: Internal Medicine

## 2015-05-12 DIAGNOSIS — J309 Allergic rhinitis, unspecified: Secondary | ICD-10-CM | POA: Diagnosis not present

## 2015-05-12 NOTE — Telephone Encounter (Addendum)
Allergy Serum Extract Date Mixed: 05/12/15 Vial: 2 Strength: 1:10 Here/Mail/Pick Up: mail Mixed By: tbs Last OV: 04/15/15 Pending OV: 10/17/15

## 2015-09-22 ENCOUNTER — Telehealth: Payer: Self-pay | Admitting: Internal Medicine

## 2015-09-22 NOTE — Telephone Encounter (Addendum)
Allergy Serum Extract Date Mixed: 09/22/15 Vial: 2 Strength: 1:10 Here/Mail/Pick Up: mail Mixed By: tbs Last OV: 04/15/15 Pending OV: 10/17/15 created in error. I didn't get a chance to make up vac.09/22/15.

## 2015-09-24 ENCOUNTER — Telehealth: Payer: Self-pay | Admitting: Internal Medicine

## 2015-09-24 DIAGNOSIS — J309 Allergic rhinitis, unspecified: Secondary | ICD-10-CM

## 2015-09-24 NOTE — Telephone Encounter (Signed)
Allergy Serum Extract Date Mixed: 09/24/15 Vial: 2 Strength: 1:10  Here/Mail/Pick Up: mail Mixed By: tbs Last OV: 04/15/15 Pending OV: 10/17/15

## 2015-10-17 ENCOUNTER — Encounter: Payer: Self-pay | Admitting: Internal Medicine

## 2015-10-17 ENCOUNTER — Ambulatory Visit (INDEPENDENT_AMBULATORY_CARE_PROVIDER_SITE_OTHER): Payer: Medicare Other | Admitting: Internal Medicine

## 2015-10-17 ENCOUNTER — Ambulatory Visit (INDEPENDENT_AMBULATORY_CARE_PROVIDER_SITE_OTHER)
Admission: RE | Admit: 2015-10-17 | Discharge: 2015-10-17 | Disposition: A | Discharge status: Home / Self Care | Payer: Medicare Other | Source: Ambulatory Visit | Attending: Internal Medicine | Admitting: Internal Medicine

## 2015-10-17 VITALS — BP 124/76 | HR 76 | Ht 62.0 in | Wt 188.0 lb

## 2015-10-17 DIAGNOSIS — J9811 Atelectasis: Secondary | ICD-10-CM

## 2015-10-17 DIAGNOSIS — J309 Allergic rhinitis, unspecified: Secondary | ICD-10-CM | POA: Diagnosis not present

## 2015-10-17 DIAGNOSIS — J302 Other seasonal allergic rhinitis: Secondary | ICD-10-CM

## 2015-10-17 DIAGNOSIS — J410 Simple chronic bronchitis: Secondary | ICD-10-CM

## 2015-10-17 DIAGNOSIS — R0600 Dyspnea, unspecified: Secondary | ICD-10-CM | POA: Diagnosis not present

## 2015-10-17 DIAGNOSIS — J3089 Other allergic rhinitis: Secondary | ICD-10-CM

## 2015-10-17 NOTE — Patient Instructions (Addendum)
As discussed, we will be stopping our allergy program here this winter. You can use up the allergy vaccine that you have and then stop your shots.   You may find that you no longer need allergy shots. For minor pollen allergy, an antihistamine like claritin or allegra, and a nasal steroid spray like Flonase may be all you need.  You can certainly establish with a new allergist at any time, if you and your primary doctor think you need to.  Order- CXR   Dx atelectasis, dyspnea

## 2015-10-17 NOTE — Progress Notes (Signed)
Patient ID: Janet LeashNancy Wagner, female    DOB: 10/17/1940, 75 y.o.   MRN: [id number]  HPI 10/22/10- 75 year old female former smoker followed for allergic rhinitis, COPD, complicated by CAD/MI Last here March 13,2012- has been in hosp twice for MI by enzymes- [facility]- [place] [facility]. Cortisone shot for sore left shoulder yesterday.  Allergy - no problem with fall season so far. Continues allergy shots with no problem.  Denies limiting SOB, cough or wheeze now.  She brings an extremely long list of meds, supplements and etc.   04/22/11-  75 year old female former smoker followed for allergic rhinitis, COPD, complicated by CAD/MI Continues allergy vaccine without problems. Bronchitis this winter required 2 rounds of Augmentin. Shortness of breath and cough have resolved. Restarted on amoxicillin March 8 for rhinitis with no sinus pain.  10/25/11-75 year old female former smoker followed for allergic rhinitis, COPD, complicated by CAD/MI  Still on vaccine 1:10 GO and doing well;has started using Zyrtec and its helping. Denies any SOB, wheezing, cough, or congestion at this time. Mild postnasal drip, not worth treating differently COPD assessment test (CABG) scored 20/40  05/25/12-75 year old female former smoker followed for allergic rhinitis, COPD, complicated by CAD/MI  Still on vaccine 1:10 GO and doing well; FOLLOWS FOR: Breathing is unchanged. Reports dry cough that sounds like the "croup," SOB and wheezing at times. Denies chest pain or chest tightness. Zyrtec is still helping manage allergy symptoms. Likes Dymista nasal spray and still using Zyrtec occasionally if needed. Intermittent dry cough without voice change, dysphagia, wheeze or reflux.  11/24/12- 75 year old female former smoker followed for allergic rhinitis, COPD/ chronic bronchitis, complicated by CAD/MI  Still on Allergy vaccine 1:10 GO and doing well; Follows For: Denies sob or wheezing,  cough, or chest discomfort - Occas runny nose Discussed hi dose flu vax [place] [facility] so we discussed transfer of records there when she establishes physician.  CXR 05/25/12- IMPRESSION:  COPD/chronic bronchitis, mild. No acute superimposed process.  Original Report Authenticated By: Jeronimo GreavesKyle Talbot, M.D.  05/25/13- 75 year old female former smoker followed for allergic rhinitis, COPD/ chronic bronchitis, complicated by CAD/MI  FOLLOWS FOR: still on allergy vaccine 1:10 GO-doing well and no reactions. PCP-Dr Earlene PlaterJohn Kihm [place], [place] Denies acute concerns or questions  10/04/13- 75 year old female former smoker followed for allergic rhinitis, COPD/ chronic bronchitis, complicated by CAD/MI  FOLLOWS NWG:NFAOZHYFOR:Allergy vaccine 1:10 GO -doing good,gaining wt.,sob same,dry cough, no wheezing, runny nose like water-clear,pnd  05/02/14- 75 year old female former smoker followed for allergic rhinitis, COPD/ chronic bronchitis, complicated by CAD/MI  FOLLOWS FOR: Allergy Vaccine 1:10 GO  working well.  Chest feels tight today with some dry cough. Doing a little better after primary physician gave Zithromax in late February but also uses "rock and rye" for cough  10/15/14-75 year old female former smoker followed for allergic rhinitis, chronic bronchitis, complicated by CAD/MI/ stent  Allergy Vaccine 1:10 GO   Follow For: pt states she is taking  Vaccine split A&B vials weekly and tolerating well and feelds they are working great for her, shes been doing pretty good.  No acute respiratory concerns and denies cough or wheeze. CAD-new stent in August. Occasional angina We reviewed her chest x-ray. Prominent markings in the bases, especially left lower lobe CXR 05/02/14 IMPRESSION: COPD changes with LEFT basilar atelectasis. Electronically Signed  By: Ulyses SouthwardMark Boles M.D.  On: 05/02/2014 17:35  Office Spirometry 10/15/2014-within normal limits. FEV1 1.7/93%, FVC 2.5/97%, FEV1/FVC 0.71, FEF 25-75 percent  1.1/76%.  04/15/2015-75 year old  female former smoker followed for allergic rhinitis, chronic bronchitis, complicated by CAD/MI/stent Allergy Vaccine 1:10 GO FOLLOWS FOR: Pt states her breathing has been doing okay but feels slight wheezing and tightness in chest. Denies any colds or flareups. Infrequent need for rescue inhaler but asks refill.  10/17/2015-75 year old female former smoker followed for allergic rhinitis, chronic bronchitis, complicated by CAD/MI/stent Allergy Vaccine 1:10 GO FOLLOWS FOR: Pt gives out when going upstairs; continues allergy injections without concerns/issues. No new or acute issues. She accepts dyspnea on exertion is consistent with her age and deconditioning. Sometimes aware of a little wheeze. Rare need for rescue inhaler.  Review of Systems-see HPI Constitutional:   No-   weight loss, night sweats, fevers, chills, fatigue, lassitude. HEENT:   No-  headaches, difficulty swallowing, tooth/dental problems, sore throat,       No-  sneezing, itching, ear ache,  nasal congestion, post nasal drip,  CV:  No-   chest pain, orthopnea, PND, swelling in lower extremities, anasarca, dizziness, palpitations Resp: No- acute  shortness of breath with exertion or at rest.              No-   productive cough,   non-productive cough,  No-  coughing up of blood.              No-   change in color of mucus.  + Little wheezing.   Skin: No-   rash or lesions. GI:  No-   heartburn, indigestion, abdominal pain, nausea, vomiting,  GU:  MS:  No-   joint pain or swelling. . Neuro- nothing unusual  Psych:  No- change in mood or affect. No depression or anxiety.  No memory loss.   Objective:   Physical Exam General- Alert, Oriented, Affect-appropriate, Distress- none acute , +overweight Skin- rash-none, lesions- none, excoriation- none     Lymphadenopathy- none Head- atraumatic            Eyes- Gross vision intact, PERRLA, conjunctivae clear secretions            Ears- Hearing,  canals- normal            Nose- Clear, no- Septal dev, mucus, polyps, erosion, perforation             Throat- Mallampati III , mucosa clear , drainage- none, tonsils- atrophic, own teeth Neck- flexible , trachea midline, no stridor , thyroid nl, carotid no bruit Chest - symmetrical excursion , unlabored           Heart/CV- RRR , no murmur , no gallop  , no rub, nl s1 s2                           - JVD- none , edema- none, stasis changes- none, varices- none           Lung- , + trace end expiratory, cough- none , dullness-none, rub- none           Chest wall-  Abd-  Br/ Gen/ Rectal- Not done, not indicated Extrem- cyanosis- none, clubbing, none, atrophy- none, strength- nl, + cane Neuro- grossly intact to observation

## 2015-10-17 NOTE — Assessment & Plan Note (Signed)
We discussed plans to end our allergy program as I slow my practice down this winter. She has the name of an allergist close to [place] in [place]. I suggested she stop her shots when current supply of vaccine is gone, then wait to see how she does. An antihistamine and possibly nasal steroid spray will likely be sufficient. She shouldn't assume that she has to immediately switched to another allergist until she finds symptoms justify. She can discuss this with her primary physician.

## 2015-10-17 NOTE — Assessment & Plan Note (Signed)
Occasional mild wheeze consistent with asthma mild uncomplicated intermittent Occasional use of a rescue inhaler has been sufficient

## 2015-10-20 NOTE — Progress Notes (Signed)
lmtcb x1 

## 2015-10-27 ENCOUNTER — Telehealth: Payer: Self-pay | Admitting: Internal Medicine

## 2015-10-27 NOTE — Telephone Encounter (Signed)
Spoke with pt and reviewed cxr results. Nothing further needed.

## 2015-10-28 ENCOUNTER — Telehealth: Payer: Self-pay | Admitting: Internal Medicine

## 2015-10-28 NOTE — Telephone Encounter (Signed)
Spoke with pt. She wants a copy of her last CXR and OV note sent to her PCP. This has been done. Nothing further was needed.

## 2015-10-28 NOTE — Telephone Encounter (Signed)
lmtcb x1 for pt. 

## 2016-08-10 DIAGNOSIS — J22 Unspecified acute lower respiratory infection: Secondary | ICD-10-CM

## 2016-08-10 HISTORY — DX: Unspecified acute lower respiratory infection: J22

## 2020-11-12 DIAGNOSIS — W19XXXA Unspecified fall, initial encounter: Secondary | ICD-10-CM | POA: Insufficient documentation

## 2020-11-12 DIAGNOSIS — E876 Hypokalemia: Secondary | ICD-10-CM

## 2020-11-12 DIAGNOSIS — N179 Acute kidney failure, unspecified: Secondary | ICD-10-CM

## 2020-11-12 HISTORY — DX: Hypokalemia: E87.6

## 2020-11-12 HISTORY — DX: Acute kidney failure, unspecified: N17.9

## 2021-04-13 ENCOUNTER — Encounter: Payer: Self-pay | Admitting: Physician Assistant

## 2021-04-24 ENCOUNTER — Ambulatory Visit: Payer: Medicare Other | Admitting: Physician Assistant

## 2021-04-29 ENCOUNTER — Other Ambulatory Visit (INDEPENDENT_AMBULATORY_CARE_PROVIDER_SITE_OTHER): Payer: Medicare Other

## 2021-04-29 ENCOUNTER — Ambulatory Visit: Payer: Medicare Other | Admitting: Physician Assistant

## 2021-04-29 ENCOUNTER — Other Ambulatory Visit: Payer: Self-pay

## 2021-04-29 VITALS — BP 154/75 | HR 74 | Resp 18 | Ht 65.0 in | Wt 140.0 lb

## 2021-04-29 DIAGNOSIS — F02A4 Dementia in other diseases classified elsewhere, mild, with anxiety: Secondary | ICD-10-CM

## 2021-04-29 DIAGNOSIS — R413 Other amnesia: Secondary | ICD-10-CM | POA: Diagnosis not present

## 2021-04-29 DIAGNOSIS — G301 Alzheimer's disease with late onset: Secondary | ICD-10-CM | POA: Diagnosis not present

## 2021-04-29 LAB — TSH: TSH: 1.21 u[IU]/mL (ref 0.35–5.50)

## 2021-04-29 LAB — VITAMIN B12: Vitamin B-12: 719 pg/mL (ref 211–911)

## 2021-04-29 NOTE — Progress Notes (Addendum)
? ? ?Assessment/Plan:  ? ?[name] is a very pleasant 81 y.o. year old RH female with  a history of hypertension, hyperlipidemia, anxiety, depression, CAD, chronic bronchitis,  seen today for evaluation of memory loss. MoCA today is 18/30 suggestive of mild dementia, with deficiencies in visuospatial executive,'s obstruction, delayed recall 1/5, but orientation normal at 6 out of 6.  Language was normal.  Patient still able to do her normal activities of daily living.  Family is requesting that the patient be evaluated for competency, and this was explained that is a different type of evaluation, information for was provided. ? ? ? Recommendations:  ? ? 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 ?MRI brain with/without contrast to assess for underlying structural abnormality and assess vascular load  ?Check B12, TSH ?Neurocognitive testing for clarity of the diagnosis, and to determine other sources of memory loss, including depression, anxiety, attention, etc. ?Please contact Dr. Anthoney Harada for assessment of decision mental capacity [phone] ?You can also call [facility] [phone] for guidance on geriatric dementia issues   ?Discussed safety both in and out of the home.  ?Discussed the importance of regular daily schedule to maintain brain function.  ?Continue to monitor mood with PCP.  ?Monitor Driving ?Stay active at least 30 minutes at least 3 times a week.  ?Naps should be scheduled and should be no longer than 60 minutes and should not occur after 2 PM.  ?Control cardiovascular risk factors  ?Mediterranean diet is recommended  ?Folllow up in 6 months ? ?Subjective:  ? ? ?The patient is seen in neurologic consultation at the request of Ferd Hibbs, NP for the evaluation of memory.  The patient is accompanied by her sister who supplements the history. ?This is a 81 y.o. year old RH  female who has had memory issues for about 1 to 2 years, when she began to notice  issues with her short-term memory.  The symptoms exacerbated about 3 or 4 months ago, after a fall requiring hospitalization.  She is currently on rivastigmine 1.5 mg twice daily by her PCP, dating at least to 2020 however.  Her sister reports that she repeats herself, but denies being disoriented when walking into her room.  She denies leaving objects in unusual places.  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  At the time of her hospitalization, her status has been complicated by a UTI, with acute mental status changes, and per patient report, "they gave me Klonopin, which made me even look.".  She denies any head injuries.  No wandering behavior.  She no longer drives since [id number] after having a "couple of bumpups".  Currently, she lives in [facility], she does not like living there, she feels that they are people constantly coming into her room and taking staff.  Her sister corroborates this, denies that the patient is being paranoid about it.  She denies hallucinations.  Her mood is anxious, and she has undergone significant depression due to family dynamics.  Apparently, she is estranged from her daughter, has not communicated with her in several years, and she is also estranged from the rest of her family except for her nephew, who is very involved in her care.  He would like to be the power of attorney, and take care of all her legal issues.  "There are a lot of scars that did not heal from the past, especially since my  daughter has come weird and does not want to talk to me ".  She is also depressed because office on who died about 2 years ago, bring in severe depression to the patient.  She still in the process of grieving.  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  She enjoys doing crossword puzzles, word finding.  She denies any hygiene concerns, she is independent of bathing and dressing, and the facility provides the  medications.  Her son is in charge of the finances.  Her appetite is normal, denies trouble swallowing.  She does not cook.  She has occasional headaches, denies double vision, dizziness, focal numbness or tingling, unilateral weakness, tremors or anosmia.  No history of sleep apnea, alcohol or tobacco.  Family history remarkable for vascular dementia in mother.  ? ? ? ?Allergies  ?Allergen Reactions  ? Atorvastatin   ?  Other reaction(s): Muscle Pain  ? Codeine   ?  REACTION: severe vomiting  ? Rosuvastatin   ?  Other reaction(s): Muscle Pain  ? Sulfonamide Derivatives   ?  REACTION: blisters in her mouth---mouth swollen  ? Iodine Rash  ? ? ?Current Outpatient Medications  ?Medication Instructions  ? Acetaminophen 500 MG coapsule 1 tablet, Oral, Daily PRN  ? Acetylcarn-Alpha Lipoic Acid 400-200 MG CAPS 1 tablet, Daily  ? albuterol (VENTOLIN HFA) 108 (90 Base) MCG/ACT inhaler 2 puffs, Inhalation, Every 6 hours PRN  ? ALPRAZolam (XANAX) 0.5 mg, 2 times daily  ? aspirin 81 mg, Daily  ? atenolol (TENORMIN) 100 mg, Oral, Daily  ? benzonatate (TESSALON) 200 mg, Oral, Every 8 hours PRN  ? buPROPion (WELLBUTRIN SR) 150 MG 12 hr tablet Take by mouth.  ? buPROPion (WELLBUTRIN SR) 150 mg, 2 times daily  ? calcium carbonate (OS-CAL) 1250 (500 Ca) MG chewable tablet Oral  ? CALCIUM-MAG-VIT C-VIT D PO 1 tablet, Daily  ? cetirizine (ZYRTEC) 10 mg, Daily  ? cholecalciferol (VITAMIN D) 1,000 Units, Daily  ? Cyanocobalamin (VITAMIN B 12 PO) 2,500 mcg, Daily  ? EPINEPHrine (EPI-PEN) 0.3 mg, Intramuscular,  Once  ? Evolocumab (REPATHA SURECLICK) [id number] MG/ML SOAJ Subcutaneous  ? ezetimibe (ZETIA) 10 MG tablet 1 tablet, Oral, Daily  ? Flaxseed, Linseed, (FLAX SEED OIL) 1000 MG CAPS 1 capsule, 3 times daily  ? fluticasone (FLONASE) 50 MCG/ACT nasal spray 2 sprays, Nasal, Daily  ? hydrochlorothiazide (MICROZIDE) 12.5 MG capsule Oral  ? losartan (COZAAR) 25 MG tablet 1 tablet, Oral, Daily  ? losartan (COZAAR) 100 mg, Daily  ? melatonin 3 MG  TABS tablet Oral  ? Multiple Vitamins-Minerals (CENTRUM SILVER PO) 1 Day/Dose  ? Multiple Vitamins-Minerals (HAIR/SKIN/NAILS) TABS 1 tablet, Daily  ? NITROSTAT 0.4 MG SL tablet No dose, route, or frequency recorded.  ? NONFORMULARY OR COMPOUNDED ITEM Allergy Vaccine<BR>1:10<BR>Given at Home   ? pantoprazole (PROTONIX) 40 MG tablet Oral  ? Potassium 99 MG TABS 1 tablet, Daily  ? rivastigmine (EXELON) 1.5 MG capsule Oral  ? traZODone (DESYREL) 50 MG tablet Oral  ? venlafaxine XR (EFFEXOR-XR) 75 MG 24 hr capsule Oral  ? vitamin C 1,000 mg, Daily  ? ? ? ?VITALS:   ?Vitals:  ? 04/29/21 1314  ?BP: (!) 154/75  ?Pulse: 74  ?Resp: 18  ?SpO2: 98%  ?Weight: 140 lb (63.5 kg)  ?Height: 5\' 5"  (1.651 m)  ? ? ? ?  04/29/2021  ?  1:00 PM  ?Montreal Cognitive Assessment   ?Visuospatial/ Executive (0/5) 2  ?Naming (0/3) 2  ?Attention: Read list of digits (  0/2) 2  ?Attention: Read list of letters (0/1) 1  ?Attention: Serial 7 subtraction starting at 100 (0/3) 0  ?Language: Repeat phrase (0/2) 2  ?Language : Fluency (0/1) 1  ?Abstraction (0/2) 1  ?Delayed Recall (0/5) 1  ?Orientation (0/6) 6  ?Total 18  ?Adjusted Score (based on education) 18  ?  ? ? ? ? ?PHYSICAL EXAM  ? ?HEENT:  Normocephalic, atraumatic. The mucous membranes are moist. The superficial temporal arteries are without ropiness or tenderness. ?Cardiovascular: Regular rate and rhythm. ?Lungs: Clear to auscultation bilaterally. ?Neck: There are no carotid bruits noted bilaterally. ? ?NEUROLOGICAL: ? ? ?Orientation:  Alert and oriented to person, place and time. No aphasia or dysarthria. Fund of knowledge is appropriate. Recent memory impaired and remote memory intact.  Attention and concentration are normal.  Able to name objects and repeat phrases. Delayed recall  1/5 ?Cranial nerves: There is good facial symmetry. Extraocular muscles are intact and visual fields are full to confrontational testing. Speech is fluent and clear. Soft palate rises symmetrically and there is no  tongue deviation. Hearing is intact to conversational tone. ?Tone: Tone is good throughout. ?Sensation: Sensation is intact to light touch and pinprick throughout. Vibration is intact at the bilateral big toe.

## 2021-04-29 NOTE — Patient Instructions (Signed)
It was a pleasure to see you today at our office.  ? ?Recommendations: ? ?Neurocognitive evaluation at our office ?Recommend increasing rivastigmine 3 mg two times a day ( by your primary doctor)  ?MRI of the brain, the radiology office will call you to arrange you appointment ?Please contact Dr. Anthoney Harada for assessment of decision mental capacity [phone] ?You can also call [facility] [phone] for guidance on geriatric dementia issues   ?Check labs today ?Follow up 6 months Aug 28  at 11:30  ? ?RECOMMENDATIONS FOR ALL PATIENTS WITH MEMORY PROBLEMS: ?1. Continue to exercise (Recommend 30 minutes of walking everyday, or 3 hours every week) ?2. Increase social interactions - continue going to [place] and enjoy social gatherings with friends and family ?3. Eat healthy, avoid fried foods and eat more fruits and vegetables ?4. Maintain adequate blood pressure, blood sugar, and blood cholesterol level. Reducing the risk of stroke and cardiovascular disease also helps promoting better memory. ?5. Avoid stressful situations. Live a simple life and avoid aggravations. Organize your time and prepare for the next day in anticipation. ?6. Sleep well, avoid any interruptions of sleep and avoid any distractions in the bedroom that may interfere with adequate sleep quality ?7. Avoid sugar, avoid sweets as there is a strong link between excessive sugar intake, diabetes, and cognitive impairment ?We discussed the Mediterranean diet, which has been shown to help patients reduce the risk of progressive memory disorders and reduces cardiovascular risk. This includes eating fish, eat fruits and green leafy vegetables, nuts like almonds and hazelnuts, walnuts, and also use olive oil. Avoid fast foods and fried foods as much as possible. Avoid sweets and sugar as sugar use has been linked to worsening of memory function. ? ?There is always a concern of gradual progression of memory problems. If this is the case,  then we may need to adjust level of care according to patient needs. Support, both to the patient and caregiver, should then be put into place.  ? ? ? ? ?You have been referred for a neuropsychological evaluation (i.e., evaluation of memory and thinking abilities). Please bring someone with you to this appointment if possible, as it is helpful for the doctor to hear from both you and another adult who knows you well. Please bring eyeglasses and hearing aids if you wear them.  ?  ?The evaluation will take approximately 3 hours and has two parts: ?  ?The first part is a clinical interview with the neuropsychologist (Dr. Melvyn Novas or Dr. Nicole Kindred). During the interview, the neuropsychologist will speak with you and the individual you brought to the appointment.  ?  ?The second part of the evaluation is testing with the doctor's technician Hinton Dyer or Maudie Mercury). During the testing, the technician will ask you to remember different types of material, solve problems, and answer some questionnaires. Your family member will not be present for this portion of the evaluation. ?  ?Please note: We must reserve several hours of the neuropsychologist's time and the psychometrician's time for your evaluation appointment. As such, there is a No-Show fee of $100. If you are unable to attend any of your appointments, please contact our office as soon as possible to reschedule.  ? ? ?FALL PRECAUTIONS: Be cautious when walking. Scan the area for obstacles that may increase the risk of trips and falls. When getting up in the mornings, sit up at the edge of the bed for a few minutes before getting out of bed. Consider elevating the bed  at the head end to avoid drop of blood pressure when getting up. Walk always in a well-lit room (use night lights in the walls). Avoid area rugs or power cords from appliances in the middle of the walkways. Use a walker or a cane if necessary and consider physical therapy for balance exercise. Get your eyesight checked  regularly. ? ?FINANCIAL OVERSIGHT: Supervision, especially oversight when making financial decisions or transactions is also recommended. ? ?HOME SAFETY: Consider the safety of the kitchen when operating appliances like stoves, microwave oven, and blender. Consider having supervision and share cooking responsibilities until no longer able to participate in those. Accidents with firearms and other hazards in the house should be identified and addressed as well. ? ? ?ABILITY TO BE LEFT ALONE: If patient is unable to contact 911 operator, consider using LifeLine, or when the need is there, arrange for someone to stay with patients. Smoking is a fire hazard, consider supervision or cessation. Risk of wandering should be assessed by caregiver and if detected at any point, supervision and safe proof recommendations should be instituted. ? ?MEDICATION SUPERVISION: Inability to self-administer medication needs to be constantly addressed. Implement a mechanism to ensure safe administration of the medications. ? ? ?DRIVING: Regarding driving, in patients with progressive memory problems, driving will be impaired. We advise to have someone else do the driving if trouble finding directions or if minor accidents are reported. Independent driving assessment is available to determine safety of driving. ? ? ?If you are interested in the driving assessment, you can contact the following: ? ?[employer] in [place] ? ?[place] [phone] ? ?[facility] [phone] ? ?[facility] [phone] or [phone] ? ? ? ?Mediterranean Diet ?A Mediterranean diet refers to food and lifestyle choices that are based on the traditions of countries located on the [employer]. This way of eating has been shown to help prevent certain conditions and improve outcomes for people who have chronic diseases, like kidney disease and heart disease. ?What are tips for following this  plan? ?Lifestyle  ?Cook and eat meals together with your family, when possible. ?Drink enough fluid to keep your urine clear or pale yellow. ?Be physically active every day. This includes: ?Aerobic exercise like running or swimming. ?Leisure activities like gardening, walking, or housework. ?Get 7-8 hours of sleep each night. ?If recommended by your health care provider, drink red wine in moderation. This means 1 glass a day for nonpregnant women and 2 glasses a day for men. A glass of wine equals 5 oz (150 mL). ?Reading food labels  ?Check the serving size of packaged foods. For foods such as rice and pasta, the serving size refers to the amount of cooked product, not dry. ?Check the total fat in packaged foods. Avoid foods that have saturated fat or trans fats. ?Check the ingredients list for added sugars, such as corn syrup. ?Shopping  ?At the grocery store, buy most of your food from the areas near the walls of the store. This includes: ?Fresh fruits and vegetables (produce). ?Grains, beans, nuts, and seeds. Some of these may be available in unpackaged forms or large amounts (in bulk). ?Fresh seafood. ?Poultry and eggs. ?Low-fat dairy products. ?Buy whole ingredients instead of prepackaged foods. ?Buy fresh fruits and vegetables in-season from local farmers markets. ?Buy frozen fruits and vegetables in resealable bags. ?If you do not have access to quality fresh seafood, buy precooked frozen shrimp or canned fish, such as tuna, salmon, or sardines. ?Buy  small amounts of raw or cooked vegetables, salads, or olives from the deli or salad bar at your store. ?Stock your pantry so you always have certain foods on hand, such as olive oil, canned tuna, canned tomatoes, rice, pasta, and beans. ?Cooking  ?Cook foods with extra-virgin olive oil instead of using butter or other vegetable oils. ?Have meat as a side dish, and have vegetables or grains as your main dish. This means having meat in small portions or adding  small amounts of meat to foods like pasta or stew. ?Use beans or vegetables instead of meat in common dishes like chili or lasagna. ?Experiment with different cooking methods. Try roasting or broiling vege

## 2021-05-06 ENCOUNTER — Telehealth: Payer: Self-pay | Admitting: Physician Assistant

## 2021-05-06 NOTE — Telephone Encounter (Signed)
The following message was left with [employer] on 05/06/21 at 12:46pm. ? ?Caller declined triage and stated that he would like to go over the recommendations list for his mother and some of the medication information. ? ?One was regarding an increase in dosage on a medication. He also wants to get a printout of the medications that she was on previously.  ? ?He did schedule her for the MRI on 05/18/21.  ? ?He would like to speak with the PA directly. ?

## 2021-05-06 NOTE — Telephone Encounter (Signed)
No answer at 2:23pm 05/06/2021  ?

## 2021-05-06 NOTE — Telephone Encounter (Signed)
Patient's son returned call to [place]. ?

## 2021-05-07 NOTE — Telephone Encounter (Signed)
Patient's son called back.

## 2021-05-07 NOTE — Telephone Encounter (Signed)
Left message to contact office again at10:04am 04/27/2021  ?

## 2021-05-08 NOTE — Telephone Encounter (Signed)
I spoke with son, he has his mother scheduled for her MRI, he was letting us know. Thanked me for calling.  ?

## 2021-05-12 ENCOUNTER — Telehealth: Payer: Self-pay | Admitting: Physician Assistant

## 2021-05-12 NOTE — Telephone Encounter (Signed)
[name] is in rehab, [name] is pending.  ?

## 2021-05-12 NOTE — Telephone Encounter (Signed)
No answer phone just keeps ringing at [phone]  ?

## 2021-05-12 NOTE — Telephone Encounter (Signed)
Broadus John called in with some concerns about patient. He would like to speak with someone about the MRI and the visits. He said he feels this is urgent matter. ?

## 2021-05-12 NOTE — Telephone Encounter (Signed)
Patient's son called back again. He is aware Marlowe Kays, PA is on vacation. ? ?He'd still like to speak to a nurse please. ?

## 2021-05-18 ENCOUNTER — Ambulatory Visit
Admission: RE | Admit: 2021-05-18 | Discharge: 2021-05-18 | Disposition: A | Discharge status: Home / Self Care | Payer: Medicare Other | Source: Ambulatory Visit | Attending: Physician Assistant | Admitting: Physician Assistant

## 2021-05-19 ENCOUNTER — Encounter: Payer: Self-pay | Admitting: Physician Assistant

## 2021-05-19 NOTE — Progress Notes (Signed)
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 Thank you

## 2021-05-22 ENCOUNTER — Encounter: Payer: Self-pay | Admitting: Physician Assistant

## 2021-08-05 ENCOUNTER — Other Ambulatory Visit: Payer: Self-pay

## 2021-08-05 ENCOUNTER — Emergency Department (HOSPITAL_BASED_OUTPATIENT_CLINIC_OR_DEPARTMENT_OTHER)
Admission: EM | Admit: 2021-08-05 | Discharge: 2021-08-05 | Disposition: A | Discharge status: Home / Self Care | Payer: Medicare Other | Attending: Emergency Medicine | Admitting: Emergency Medicine

## 2021-08-05 ENCOUNTER — Encounter (HOSPITAL_BASED_OUTPATIENT_CLINIC_OR_DEPARTMENT_OTHER): Payer: Self-pay

## 2021-08-05 ENCOUNTER — Emergency Department (HOSPITAL_BASED_OUTPATIENT_CLINIC_OR_DEPARTMENT_OTHER): Payer: Medicare Other

## 2021-08-05 DIAGNOSIS — Z7982 Long term (current) use of aspirin: Secondary | ICD-10-CM | POA: Insufficient documentation

## 2021-08-05 DIAGNOSIS — R519 Headache, unspecified: Secondary | ICD-10-CM | POA: Diagnosis present

## 2021-08-05 DIAGNOSIS — Z79899 Other long term (current) drug therapy: Secondary | ICD-10-CM | POA: Diagnosis not present

## 2021-08-05 NOTE — ED Notes (Signed)
Discharge instructions and follow up care reviewed and explained. Pt states she does not want to wait for results of CT and would follow up with her Mychart and PCP.

## 2021-08-05 NOTE — ED Provider Notes (Addendum)
[facility] EMERGENCY DEPT Provider Note   CSN: [id number] Arrival date & time: 08/05/21  1524     History  Chief Complaint  Patient presents with   Headache    [name] is a 81 y.o. female.  Patient presents to the hospital complaining of a severe headache.  Patient has history of similar headaches and took Advil earlier today which mildly reduced the pain.  Her primary care provider advised her to come to the emergency department for evaluation.  The patient denies any nausea, vomiting, diarrhea, no fever, she does complain of mild photosensitivity.  Denies any loss of consciousness, trauma, any blood thinner usage.  HPI     Home Medications Prior to Admission medications   Medication Sig Start Date End Date Taking? Authorizing Provider  Acetaminophen 500 MG coapsule Take 1 tablet by mouth daily as needed.    [provider]  Acetylcarn-Alpha Lipoic Acid 400-200 MG CAPS Take 1 tablet by mouth daily.      [provider]  albuterol (VENTOLIN HFA) 108 (90 Base) MCG/ACT inhaler Inhale 2 puffs into the lungs every 6 (six) hours as needed. 04/15/15   Waymon Budge, MD  ALPRAZolam Prudy Feeler) 0.5 MG tablet Take 0.5 mg by mouth 2 (two) times daily.     [provider]  Ascorbic Acid (VITAMIN C) 1000 MG tablet Take 1,000 mg by mouth daily.    [provider]  aspirin 81 MG tablet Take 81 mg by mouth daily.      [provider]  atenolol (TENORMIN) 100 MG tablet Take 100 mg by mouth daily.    [provider]  benzonatate (TESSALON) 200 MG capsule Take 1 capsule (200 mg total) by mouth every 8 (eight) hours as needed for cough. Patient not taking: Reported on 04/29/2021 05/07/14   Waymon Budge, MD  buPROPion [facility] SR) 150 MG 12 hr tablet Take 150 mg by mouth 2 (two) times daily.      [provider]  buPROPion (WELLBUTRIN SR) 150 MG 12 hr tablet Take by mouth. Patient not taking: Reported on 04/29/2021  01/02/13   [provider]  calcium carbonate (OS-CAL) 1250 (500 Ca) MG chewable tablet Chew by mouth. 02/12/21 02/12/22  [provider]  CALCIUM-MAG-VIT C-VIT D PO Take 1 tablet by mouth daily.      [provider]  cetirizine (ZYRTEC) 10 MG tablet Take 10 mg by mouth daily.    [provider]  cholecalciferol (VITAMIN D) 1000 UNITS tablet Take 1,000 Units by mouth daily.      [provider]  Cyanocobalamin (VITAMIN B 12 PO) Take 2,500 mcg by mouth daily.      [provider]  EPINEPHrine (EPI-PEN) 0.3 mg/0.3 mL SOAJ injection Inject 0.3 mLs (0.3 mg total) into the muscle once. 05/25/13   Waymon Budge, MD  Evolocumab (REPATHA SURECLICK) 140 MG/ML SOAJ Inject into the skin.    [provider]  ezetimibe (ZETIA) 10 MG tablet Take 1 tablet by mouth daily. 08/20/14 04/29/21  [provider]  Flaxseed, Linseed, (FLAX SEED OIL) 1000 MG CAPS Take 1 capsule by mouth 3 (three) times daily.      [provider]  fluticasone (FLONASE) 50 MCG/ACT nasal spray Place 2 sprays into the nose daily. 11/24/12 04/15/15  Jetty Duhamel D, MD  hydrochlorothiazide (MICROZIDE) 12.5 MG capsule Take by mouth. 07/10/18   [provider]  losartan (COZAAR) 100 MG tablet Take 100 mg by mouth daily.  [provider]  losartan (COZAAR) 25 MG tablet Take 1 tablet by mouth daily. 02/12/21   [provider]  melatonin 3 MG TABS tablet Take by mouth. 02/12/21   [provider]  Multiple Vitamins-Minerals (CENTRUM SILVER PO) Take by mouth 1 day or 1 dose.    [provider]  Multiple Vitamins-Minerals (HAIR/SKIN/NAILS) TABS Take 1 tablet by mouth daily.    [provider]  NITROSTAT 0.4 MG SL tablet  08/22/10   [provider]  NONFORMULARY OR COMPOUNDED ITEM Allergy Vaccine 1:10 Given at Home    [provider]  pantoprazole (PROTONIX) 40 MG tablet Take by mouth. 02/12/21 02/12/22   [provider]  Potassium 99 MG TABS Take 1 tablet by mouth daily.      [provider]  rivastigmine (EXELON) 1.5 MG capsule Take by mouth. 10/30/18   [provider]  traZODone (DESYREL) 50 MG tablet Take by mouth. 02/12/21 02/12/22  [provider]  venlafaxine XR (EFFEXOR-XR) 75 MG 24 hr capsule Take by mouth.    [provider]      Allergies    Atorvastatin, Codeine, Rosuvastatin, Sulfonamide derivatives, and Iodine    Review of Systems   Review of Systems  Eyes:  Positive for photophobia.  Neurological:  Positive for headaches.    Physical Exam Updated Vital Signs BP 102/67 (BP Location: Right Arm)   Pulse 83   Temp 98 F (36.7 C) (Oral)   Resp 16   Ht 5\' 5"  (1.651 m)   Wt 63.5 kg   SpO2 93%   BMI 23.30 kg/m  Physical Exam Constitutional:      General: She is not in acute distress. HENT:     Head: Normocephalic and atraumatic.  Eyes:     Extraocular Movements: Extraocular movements intact.  Cardiovascular:     Rate and Rhythm: Normal rate.  Pulmonary:     Effort: Pulmonary effort is normal.  Musculoskeletal:        General: Normal range of motion.     Cervical back: Normal range of motion.  Neurological:     Mental Status: She is alert.     ED Results / Procedures / Treatments   Labs (all labs ordered are listed, but only abnormal results are displayed) Labs Reviewed - No data to display  EKG None  Radiology No results found.  Procedures Procedures    Medications Ordered in ED Medications - No data to display  ED Course/ Medical Decision Making/ A&P                           Medical Decision Making Amount and/or Complexity of Data Reviewed Radiology: ordered.   Prior to my assessment patient states that headache subsided significantly.  I discussed severity of headache with the patient and she does endorse it being the worst that she has had recently.  I suggested head CT for evaluation.  Patient  declines at this time.  Patient is followed by [facility] neurology and had an MRI brain in April of this year.  Patient request discharged home due to improvement in headache.  This is reasonable.  Patient given return precautions including worsening headache, severity of headache, altered level consciousness.  Follow-up with primary care and neurology as needed    Addendum: As the nurse was handing patient discharge paperwork, CT tech arrived to take patient to CT.  Patient stated she would go for CT but was  leaving prior to results.  Patient states she will follow results on MyChart    Final Clinical Impression(s) / ED Diagnoses Final diagnoses:  Nonintractable headache, unspecified chronicity pattern, unspecified headache type    Rx / DC Orders ED Discharge Orders     None         Pamala Duffel 08/05/21 1708    Darrick Grinder, PA-C 08/05/21 1720    Milagros Loll, MD 08/05/21 2031

## 2021-08-05 NOTE — Discharge Instructions (Signed)
You were seen today for a headache.  You have stated that your headache is much improved and that you declined CT scan.  This is reasonable.  If your headache returns or becomes much more severe, please seek reevaluation which may include a CT scan at that time.  Follow-up with primary care

## 2021-08-05 NOTE — ED Triage Notes (Signed)
Patient here POV from Home.  Endorses having Headache for approximately 2 Hours. Seen by PCP today and given Advil which was ineffective. PCP recommended ED Evaluation as it is "The worst headache of my life".  No N/V/D. No Fevers. Mild Photosensitivity. History of Headaches.  NAD Noted during Triage. A&Ox4. GCS 15. BIB Wheelchair.

## 2021-08-28 ENCOUNTER — Ambulatory Visit: Payer: Medicare Other | Admitting: Physician Assistant

## 2021-08-28 ENCOUNTER — Encounter: Payer: Self-pay | Admitting: Physician Assistant

## 2021-08-28 VITALS — BP 130/78 | HR 102 | Resp 18 | Ht 65.0 in | Wt 144.0 lb

## 2021-08-28 DIAGNOSIS — F02A4 Dementia in other diseases classified elsewhere, mild, with anxiety: Secondary | ICD-10-CM

## 2021-08-28 DIAGNOSIS — G301 Alzheimer's disease with late onset: Secondary | ICD-10-CM | POA: Diagnosis not present

## 2021-08-28 NOTE — Progress Notes (Signed)
Assessment/Plan:   Dementia likely due to  [name] is a very pleasant 81 y.o. RH female seen today in follow up for memory loss. Patient is currently on   Recommendations:    Continue  10 mg   Side effects were discussed Follow up in   months.   Case discussed with Dr. Karel Jarvis who agrees with the plan [name] is a very pleasant 81 y.o. year old RH female with  a history of hypertension, hyperlipidemia, anxiety, depression, CAD, chronic bronchitis,  seen today for evaluation of memory loss. MoCA today is 18/30 suggestive of mild dementia, with deficiencies in visuospatial executive,'s obstruction, delayed recall 1/5, but orientation normal at 6 out of 6.  Language was normal.  Patient still able to do her normal activities of daily living.  Family is requesting that the patient be evaluated for competency, and this was explained that is a different type of evaluation, information for was provided.    Subjective:    This patient is accompanied in the office by her who supplements the history.  Previous records as well as any outside records available were reviewed prior to todays visit.Patient was last seen at our office on  at which time her     Any changes in memory since last visit? Some changes, forgets recent conversations, I have to write it down  Patient lives with: with her son  repeats oneself?  Endorsed Disoriented when walking into a room?  Patient denies   Leaving objects in unusual places?  Patient denies   Ambulates  with difficulty?   Patient denies   Recent falls?  Patient denies   Any head injuries?  Patient denies   History of seizures?   Patient denies   Wandering behavior?  Patient denies   Patient drives?   Patient no longer drives  Any mood changes such irritability agitation?  Patient denies   Any history of depression?:  Patient denies   Hallucinations?  Patient denies   Paranoia?  Patient denies   Patient reports that he sleeps well without  vivid dreams, REM behavior or sleepwalking   History of sleep apnea?  Patient denies   Any hygiene concerns?  Patient denies   Independent of bathing and dressing?  Endorsed  Does the patient needs help with medications?  Denies Who is in charge of the finances?   is in charge    Any changes in appetite?  Patient denies   Patient have trouble swallowing? Patient denies   Does the patient cook?  Patient denies   Any kitchen accidents such as leaving the stove on? Patient denies   Any headaches?  Patient denies   Double vision? Patient denies   Any focal numbness or tingling?  Patient denies   Chronic back pain Patient denies   Unilateral weakness?  Patient denies   Any tremors?  Patient denies   Any history of anosmia?  Patient denies   Any incontinence of urine?  Patient denies   Any bowel dysfunction?   Patient denies         Initial visit 04/29/2021 the patient is seen in neurologic consultation at the request of Lance Bosch, NP for the evaluation of memory.  The patient is accompanied by her sister who supplements the history. This is a 81 y.o. year old RH  female who has had memory issues for about 1 to 2 years, when she began to notice issues with her short-term memory.  The symptoms exacerbated about  3 or 4 months ago, after a fall requiring hospitalization.  She is currently on rivastigmine 1.5 mg twice daily by her PCP, dating at least to 2020 however.  Her sister reports that she repeats herself, but denies being disoriented when walking into her room.  She denies leaving objects in unusual places.  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  At the time of her hospitalization, her status has been complicated by a UTI, with acute mental status changes, and per patient report, "they gave me Klonopin, which made me even look.".  She denies any head injuries.  No wandering behavior.  She no longer drives since 2020 after  having a "couple of bumpups".  Currently, she lives in [facility], she does not like living there, she feels that they are people constantly coming into her room and taking staff.  Her sister corroborates this, denies that the patient is being paranoid about it.  She denies hallucinations.  Her mood is anxious, and she has undergone significant depression due to family dynamics.  Apparently, she is estranged from her daughter, has not communicated with her in several years, and she is also estranged from the rest of her family except for her nephew, who is very involved in her care.  He would like to be the power of attorney, and take care of all her legal issues.  "There are a lot of scars that did not heal from the past, especially since my daughter has come weird and does not want to talk to me ".  She is also depressed because office on who died about 2 years ago, bring in severe depression to the patient.  She still in the process of grieving.  She is a former Programmer, [employer] of [employer] ".  She enjoys doing crossword puzzles, word finding.  She denies any hygiene concerns, she is independent of bathing and dressing, and the facility provides the medications.  Her son is in charge of the finances.  Her appetite is normal, denies trouble swallowing.  She does not cook.  She has occasional headaches, denies double vision, dizziness, focal numbness or tingling, unilateral weakness, tremors or anosmia.  No history of sleep apnea, alcohol or tobacco.  Family history remarkable for vascular dementia in mother.   MRI of the brain 05/19/2021 no evidence of acute intracranial abnormality.2. 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   Acetylcarn-Alpha  Lipoic Acid 400-200 MG CAPS Take 1 tablet by mouth daily.     albuterol (VENTOLIN HFA) 108 (90 Base) MCG/ACT inhaler Inhale 2 puffs into the lungs every 6 (six) hours as needed.   ALPRAZolam (XANAX) 0.5 MG tablet Take 0.5 mg by mouth 2 (two) times daily.    Ascorbic Acid (VITAMIN C) 1000 MG tablet Take 1,000 mg by mouth daily.   aspirin 81 MG tablet Take 81 mg by mouth daily.     atenolol (TENORMIN) 100 MG tablet Take 100 mg by mouth daily.   benzonatate (TESSALON) 200 MG capsule Take 1 capsule (200 mg total) by mouth every 8 (eight) hours as needed for cough. (Patient not taking: Reported on 04/29/2021)   buPROPion (WELLBUTRIN SR) 150 MG 12 hr tablet Take  150 mg by mouth 2 (two) times daily.     buPROPion (WELLBUTRIN SR) 150 MG 12 hr tablet Take by mouth. (Patient not taking: Reported on 04/29/2021)   calcium carbonate (OS-CAL) 1250 (500 Ca) MG chewable tablet Chew by mouth.   CALCIUM-MAG-VIT C-VIT D PO Take 1 tablet by mouth daily.     cetirizine (ZYRTEC) 10 MG tablet Take 10 mg by mouth daily.   cholecalciferol (VITAMIN D) 1000 UNITS tablet Take 1,000 Units by mouth daily.     Cyanocobalamin (VITAMIN B 12 PO) Take 2,500 mcg by mouth daily.     EPINEPHrine (EPI-PEN) 0.3 mg/0.3 mL SOAJ injection Inject 0.3 mLs (0.3 mg total) into the muscle once.   Evolocumab (REPATHA SURECLICK) 140 MG/ML SOAJ Inject into the skin.   ezetimibe (ZETIA) 10 MG tablet Take 1 tablet by mouth daily.   Flaxseed, Linseed, (FLAX SEED OIL) 1000 MG CAPS Take 1 capsule by mouth 3 (three) times daily.     fluticasone (FLONASE) 50 MCG/ACT nasal spray Place 2 sprays into the nose daily.   hydrochlorothiazide (MICROZIDE) 12.5 MG capsule Take by mouth.   losartan (COZAAR) 100 MG tablet Take 100 mg by mouth daily.     losartan (COZAAR) 25 MG tablet Take 1 tablet by mouth daily.   melatonin 3 MG TABS tablet Take by mouth.   Multiple Vitamins-Minerals (CENTRUM SILVER PO) Take by mouth 1 day or 1 dose.   Multiple Vitamins-Minerals  (HAIR/SKIN/NAILS) TABS Take 1 tablet by mouth daily.   NITROSTAT 0.4 MG SL tablet    NONFORMULARY OR COMPOUNDED ITEM Allergy Vaccine 1:10 Given at Home   pantoprazole (PROTONIX) 40 MG tablet Take by mouth.   Potassium 99 MG TABS Take 1 tablet by mouth daily.     rivastigmine (EXELON) 1.5 MG capsule Take by mouth.   traZODone (DESYREL) 50 MG tablet Take by mouth.   venlafaxine XR (EFFEXOR-XR) 75 MG 24 hr capsule Take by mouth.   No facility-administered encounter medications on file as of 08/28/2021.        No data to display            04/29/2021    1:00 PM  Montreal Cognitive Assessment   Visuospatial/ Executive (0/5) 2  Naming (0/3) 2  Attention: Read list of digits (0/2) 2  Attention: Read list of letters (0/1) 1  Attention: Serial 7 subtraction starting at 100 (0/3) 0  Language: Repeat phrase (0/2) 2  Language : Fluency (0/1) 1  Abstraction (0/2) 1  Delayed Recall (0/5) 1  Orientation (0/6) 6  Total 18  Adjusted Score (based on education) 18    Objective:     PHYSICAL EXAMINATION:    VITALS:  There were no vitals filed for this visit.  GEN:  The patient appears stated age and is in NAD. HEENT:  Normocephalic, atraumatic.   Neurological examination:  General: NAD, well-groomed, appears stated age. Orientation: The patient is alert. Oriented to person, place and date Cranial nerves: There is good facial symmetry.The speech is fluent and clear. No aphasia or dysarthria. Fund of knowledge is appropriate. Recent and remote memory are impaired. Attention and concentration are reduced.  Able to name objects and repeat phrases.  Hearing is intact to conversational tone.    Sensation: Sensation is intact to light touch throughout Motor: Strength is at least antigravity x4. Tremors: none  DTR's 2/4 in UE/LE     Movement examination: Tone: There is normal tone in the UE/LE Abnormal movements:  no tremor.  No myoclonus.  No asterixis.   Coordination:  There is no  decremation with RAM's. Normal finger to nose  Gait and Station: The patient has no difficulty arising out of a deep-seated chair without the use of the hands. The patient's stride length is good.  Gait is cautious and narrow.    Thank you for allowing [place] the opportunity to participate in the care of this nice patient. Please do not hesitate to contact us for any questions or concerns.   Total time spent on today's visit was *** minutes dedicated to this patient today, preparing to see patient, examining the patient, ordering tests and/or medications and counseling the patient, documenting clinical information in the EHR or other health record, independently interpreting results and communicating results to the patient/family, discussing treatment and goals, answering patient's questions and coordinating care.  Cc:  Kyra Manges, MD  Marlowe Kays 08/28/2021 12:17 PM

## 2021-08-28 NOTE — Patient Instructions (Addendum)
It was a pleasure to see you today at our office.   Recommendations:  Follow up in  6 months Recommend to go over all of the medications to make sure you are not overmedicating yourself or dupliating Continue rivastigmine 3 mg twice daily Consider psychotherapy and counseling for depression and anxiety Decrease the use of NSAIDs to no more than twice a week Monitor you cardiovascular risk factors     Whom to call:  Memory  decline, memory medications: Call our office [phone]   For psychiatric meds, mood meds: Please have your primary care physician manage these medications.   Counseling regarding caregiver distress, including caregiver depression, anxiety and issues regarding community resources, adult day care programs, adult living facilities, or memory care questions:   Feel free to contact Misty Lisabeth Register, Social Worker at [phone]   For assessment of decision of mental capacity and competency:  Call Dr. Erick Blinks, geriatric psychiatrist at [phone]  For guidance in geriatric dementia issues please call [facility] [phone]  For guidance regarding [facility] and if placement were needed at the facility, contact Sidney Ace, Social Worker tel: [phone]  Consider [facility]  [address], [place] [address] [phone]  Hours of Operation Mondays to Thursdays: 8 am to 8 pm,Fridays: 9 am to 8 pm, Saturdays: 9 am to 1 pm Sundays: Closed  [email or web address]   If you have any severe symptoms of a stroke, or other severe issues such as confusion,severe chills or fever, etc call 911 or go to the ER as you may need to be evaluated further   Feel free to visit Facebook page " [email or web address]" for tips of how to care for people with memory problems.         RECOMMENDATIONS FOR ALL PATIENTS WITH MEMORY PROBLEMS: 1.  Continue to exercise (Recommend 30 minutes of walking everyday, or 3 hours every week) 2. Increase social interactions - continue going to [place] and enjoy social gatherings with friends and family 3. Eat healthy, avoid fried foods and eat more fruits and vegetables 4. Maintain adequate blood pressure, blood sugar, and blood cholesterol level. Reducing the risk of stroke and cardiovascular disease also helps promoting better memory. 5. Avoid stressful situations. Live a simple life and avoid aggravations. Organize your time and prepare for the next day in anticipation. 6. Sleep well, avoid any interruptions of sleep and avoid any distractions in the bedroom that may interfere with adequate sleep quality 7. Avoid sugar, avoid sweets as there is a strong link between excessive sugar intake, diabetes, and cognitive impairment We discussed the Mediterranean diet, which has been shown to help patients reduce the risk of progressive memory disorders and reduces cardiovascular risk. This includes eating fish, eat fruits and green leafy vegetables, nuts like almonds and hazelnuts, walnuts, and also use olive oil. Avoid fast foods and fried foods as much as possible. Avoid sweets and sugar as sugar use has been linked to worsening of memory function.  There is always a concern of gradual progression of memory problems. If this is the case, then we may need to adjust level of care according to patient needs. Support, both to the patient and caregiver, should then be put into place.    FALL PRECAUTIONS: Be cautious when walking. Scan the area for obstacles that may increase the risk of trips and falls. When getting up in the mornings, sit up at the edge of the bed for a few minutes before getting  out of bed. Consider elevating the bed at the head end to avoid drop of blood pressure when getting up. Walk always in a well-lit room (use night lights in the walls). Avoid area rugs or power cords from appliances in the  middle of the walkways. Use a walker or a cane if necessary and consider physical therapy for balance exercise. Get your eyesight checked regularly.  FINANCIAL OVERSIGHT: Supervision, especially oversight when making financial decisions or transactions is also recommended.  HOME SAFETY: Consider the safety of the kitchen when operating appliances like stoves, microwave oven, and blender. Consider having supervision and share cooking responsibilities until no longer able to participate in those. Accidents with firearms and other hazards in the house should be identified and addressed as well.   ABILITY TO BE LEFT ALONE: If patient is unable to contact 911 operator, consider using [employer], or when the need is there, arrange for someone to stay with patients. Smoking is a fire hazard, consider supervision or cessation. Risk of wandering should be assessed by caregiver and if detected at any point, supervision and safe proof recommendations should be instituted.  MEDICATION SUPERVISION: Inability to self-administer medication needs to be constantly addressed. Implement a mechanism to ensure safe administration of the medications.   DRIVING: Regarding driving, in patients with progressive memory problems, driving will be impaired. We advise to have someone else do the driving if trouble finding directions or if minor accidents are reported. Independent driving assessment is available to determine safety of driving.   If you are interested in the driving assessment, you can contact the following:  [employer] in [place] [phone]  [facility] [phone]  [facility] [phone]    Limit use of pain relievers to no more than 2 days out of the week.  These medications include acetaminophen, NSAIDs (ibuprofen/Advil/Motrin, naproxen/Aleve, triptans (Imitrex/sumatriptan), Excedrin, and narcotics.  This will help reduce risk of rebound headaches. Be  aware of common food triggers:  - Caffeine:  coffee, black tea, cola, Mt. Dew  - Chocolate  - Dairy:  aged cheeses (brie, blue, cheddar, gouda, [place], provolone, [place], Swiss, etc), chocolate milk, buttermilk, sour cream, limit eggs and yogurt  - Nuts, peanut butter  - Alcohol  - Cereals/grains:  FRESH breads (fresh bagels, sourdough, doughnuts), yeast productions  - Processed/canned/aged/cured meats (pre-packaged deli meats, hotdogs)  - MSG/glutamate:  soy sauce, flavor enhancer, pickled/preserved/marinated foods  - Sweeteners:  aspartame (Equal, Nutrasweet).  Sugar and Splenda are okay  - Vegetables:  legumes (lima beans, lentils, snow peas, fava beans, pinto peans, peas, garbanzo beans), sauerkraut, onions, olives, pickles  - Fruit:  avocados, bananas, citrus fruit (orange, lemon, grapefruit), mango  - Other:  Frozen meals, macaroni and cheese Routine exercise Stay adequately hydrated (aim for 64 oz water daily) Keep headache diarMaintain proper stress management Maintain proper sleep hygiene Do not skip meals Consider supplements:  magnesium citrate 400mg  daily, riboflavin 400mg  daily, coenzyme Q10 100mg  three times daily.   [phone] or [phone]

## 2021-09-18 DIAGNOSIS — E871 Hypo-osmolality and hyponatremia: Secondary | ICD-10-CM

## 2021-09-18 HISTORY — DX: Hypo-osmolality and hyponatremia: E87.1

## 2021-09-23 DIAGNOSIS — I2510 Atherosclerotic heart disease of native coronary artery without angina pectoris: Secondary | ICD-10-CM | POA: Insufficient documentation

## 2021-09-23 DIAGNOSIS — F411 Generalized anxiety disorder: Secondary | ICD-10-CM

## 2021-09-23 DIAGNOSIS — I10 Essential (primary) hypertension: Secondary | ICD-10-CM | POA: Insufficient documentation

## 2021-09-23 HISTORY — DX: Atherosclerotic heart disease of native coronary artery without angina pectoris: I25.10

## 2021-09-23 HISTORY — DX: Essential (primary) hypertension: I10

## 2021-09-23 HISTORY — DX: Generalized anxiety disorder: F41.1

## 2021-10-05 ENCOUNTER — Ambulatory Visit: Payer: Medicare Other | Admitting: Physician Assistant

## 2021-10-29 DIAGNOSIS — E785 Hyperlipidemia, unspecified: Secondary | ICD-10-CM | POA: Insufficient documentation

## 2021-10-29 HISTORY — DX: Hyperlipidemia, unspecified: E78.5

## 2021-12-24 ENCOUNTER — Ambulatory Visit: Payer: Medicare Other | Admitting: Psychology

## 2021-12-24 ENCOUNTER — Encounter: Payer: Self-pay | Admitting: Psychology

## 2021-12-24 DIAGNOSIS — F329 Major depressive disorder, single episode, unspecified: Secondary | ICD-10-CM | POA: Insufficient documentation

## 2021-12-24 DIAGNOSIS — F0280 Dementia in other diseases classified elsewhere without behavioral disturbance: Secondary | ICD-10-CM

## 2021-12-24 DIAGNOSIS — R4189 Other symptoms and signs involving cognitive functions and awareness: Secondary | ICD-10-CM

## 2021-12-24 DIAGNOSIS — G309 Alzheimer's disease, unspecified: Secondary | ICD-10-CM

## 2021-12-24 DIAGNOSIS — I209 Angina pectoris, unspecified: Secondary | ICD-10-CM

## 2021-12-24 DIAGNOSIS — J449 Chronic obstructive pulmonary disease, unspecified: Secondary | ICD-10-CM | POA: Insufficient documentation

## 2021-12-24 DIAGNOSIS — J45909 Unspecified asthma, uncomplicated: Secondary | ICD-10-CM

## 2021-12-24 DIAGNOSIS — I214 Non-ST elevation (NSTEMI) myocardial infarction: Secondary | ICD-10-CM | POA: Insufficient documentation

## 2021-12-24 DIAGNOSIS — M797 Fibromyalgia: Secondary | ICD-10-CM | POA: Insufficient documentation

## 2021-12-24 HISTORY — DX: Angina pectoris, unspecified: I20.9

## 2021-12-24 HISTORY — DX: Dementia in other diseases classified elsewhere, unspecified severity, without behavioral disturbance, psychotic disturbance, mood disturbance, and anxiety: F02.80

## 2021-12-24 HISTORY — DX: Unspecified asthma, uncomplicated: J45.909

## 2021-12-24 HISTORY — DX: Major depressive disorder, single episode, unspecified: F32.9

## 2021-12-24 NOTE — Progress Notes (Signed)
   Psychometrician Note   Cognitive testing was administered to [name] by Wallace Keller, B.S. (psychometrist) under the supervision of Dr. Newman Nickels, Ph.D., licensed psychologist on 12/24/2021. [name] did not appear overtly distressed by the testing session per behavioral observation or responses across self-report questionnaires. Rest breaks were offered.    The battery of tests administered was selected by Dr. Newman Nickels, Ph.D. with consideration to [name]'s current level of functioning, the nature of her symptoms, emotional and behavioral responses during interview, level of literacy, observed level of motivation/effort, and the nature of the referral question. This battery was communicated to the psychometrist. Communication between Dr. Newman Nickels, Ph.D. and the psychometrist was ongoing throughout the evaluation and Dr. Newman Nickels, Ph.D. was immediately accessible at all times. Dr. Newman Nickels, Ph.D. provided supervision to the psychometrist on the date of this service to the extent necessary to assure the quality of all services provided.    [name] will return within approximately 1-2 weeks for an interactive feedback session with Dr. Milbert Coulter at which time her test performances, clinical impressions, and treatment recommendations will be reviewed in detail. [name] understands she can contact our office should she require our assistance before this time.  A total of 95 minutes of billable time were spent face-to-face with [name] by the psychometrist. This includes both test administration and scoring time. Billing for these services is reflected in the clinical report generated by Dr. Newman Nickels, Ph.D.  This note reflects time spent with the psychometrician and does not include test scores or any clinical interpretations made by Dr. Milbert Coulter. The full report will follow in a separate note.

## 2021-12-24 NOTE — Progress Notes (Signed)
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 was given the opportunity to ask questions and receive answers about the evaluation. Oral consent to participate was provided by the patient.   This evaluation was conducted by Newman Nickels, Ph.D., ABPP-CN, board certified clinical neuropsychologist. [name]. Battaglia completed a clinical interview with Dr. Milbert Coulter, billed as one unit [phone], and 95 minutes of cognitive testing and scoring, billed as one unit [phone] and two additional units [id number]. Psychometrist Wallace Keller, B.S., assisted Dr. Milbert Coulter with test administration and scoring procedures. As a separate and discrete service, Dr. Milbert Coulter spent a total of 160 minutes in interpretation and report writing billed as one unit [phone] and two units 96133.

## 2022-01-06 ENCOUNTER — Ambulatory Visit: Payer: Medicare Other | Admitting: Psychology

## 2022-01-06 DIAGNOSIS — F0280 Dementia in other diseases classified elsewhere without behavioral disturbance: Secondary | ICD-10-CM | POA: Diagnosis not present

## 2022-01-06 DIAGNOSIS — G309 Alzheimer's disease, unspecified: Secondary | ICD-10-CM

## 2022-01-06 NOTE — Progress Notes (Signed)
   Neuropsychology Feedback Session [name]. [facility] Department of Neurology  Reason for Referral:   [name] is a 81 y.o. right-handed Caucasian female referred by Marlowe Kays, PA-C, to characterize her current cognitive functioning and assist with diagnostic clarity and treatment planning in the context of subjective cognitive decline and concerns for an underlying neurodegenerative illness.   Feedback:   [name] completed a comprehensive neuropsychological evaluation on 12/24/2021. Please refer to that encounter for the full report and recommendations. Briefly, results suggested profound impairments surrounding all aspects of learning and memory. Additionally significant impairments were exhibited across confrontation naming, semantic fluency, and cognitive flexibility. Variability but generally below expectation performances were noted across visuospatial abilities. Regarding etiology, testing patterns are unfortunately suggestive of underlying Alzheimer's disease. [name] did not benefit from the repeated presentation of information, was fully amnestic (i.e., 0% retention) across all memory tasks, and performed very poorly across yes/no recognition trials. Taken together, this suggests rapid forgetting and a significant memory storage impairment, both of which are the hallmark characteristics of this illness. Furthermore, impairments in confrontation naming and semantic fluency would follow classic Alzheimer's disease progression, making this illness more likely.   [name] was accompanied by her son during the current feedback session. Content of the current session focused on the results of her neuropsychological evaluation. [name] was given the opportunity to ask questions and her questions were answered. She was encouraged to reach out should additional questions arise. A copy of her report was provided at the conclusion of the visit.      30 minutes  were spent conducting the current feedback session with [name], billed as one unit [phone].

## 2022-03-08 ENCOUNTER — Encounter: Payer: Self-pay | Admitting: Physician Assistant

## 2022-03-08 ENCOUNTER — Ambulatory Visit: Payer: Medicare Other | Admitting: Physician Assistant

## 2022-03-08 VITALS — BP 130/76 | HR 86 | Resp 18 | Ht 65.0 in | Wt 163.0 lb

## 2022-03-08 DIAGNOSIS — F0280 Dementia in other diseases classified elsewhere without behavioral disturbance: Secondary | ICD-10-CM

## 2022-03-08 DIAGNOSIS — G309 Alzheimer's disease, unspecified: Secondary | ICD-10-CM | POA: Diagnosis not present

## 2022-03-08 MED ORDER — RIVASTIGMINE TARTRATE 3 MG PO CAPS: 3.0000 mg | ORAL_CAPSULE | Freq: Two times a day (BID) | ORAL | 3 refills | Status: AC

## 2022-03-08 NOTE — Patient Instructions (Addendum)
It was a pleasure to see you today at our office.   Recommendations:  Follow up in  6 months Rrivastigmine 3 mg twice daily     Whom to call:  Memory  decline, memory medications: Call our office [phone]   For psychiatric meds, mood meds: Please have your primary care physician manage these medications.    For assessment of decision of mental capacity and competency:  Call Dr. Anthoney Harada, geriatric psychiatrist at [phone]  For guidance in geriatric dementia issues please call [facility] [phone]  For guidance regarding [facility] and if placement were needed at the facility, contact Arnell Asal, Social Worker tel: [phone]  Consider [facility] [place]  [place], [place] [address] [phone]  Hours of Operation Mondays to Thursdays: 8 am to 8 pm,Fridays: 9 am to 8 pm, Saturdays: 9 am to 1 pm Sundays: Closed  https://[email or web address].gov/departments/parks-recreation/active-adults-50/[facility]   If you have any severe symptoms of a stroke, or other severe issues such as confusion,severe chills or fever, etc call 911 or go to the ER as you may need to be evaluated further   Feel free to visit Facebook page " [facility]" for tips of how to care for people with memory problems.         RECOMMENDATIONS FOR ALL PATIENTS WITH MEMORY PROBLEMS: 1. Continue to exercise (Recommend 30 minutes of walking everyday, or 3 hours every week) 2. Increase social interactions - continue going to [place] and enjoy social gatherings with friends and family 3. Eat healthy, avoid fried foods and eat more fruits and vegetables 4. Maintain adequate blood pressure, blood sugar, and blood cholesterol level. Reducing the risk of stroke and cardiovascular disease also helps promoting better memory. 5. Avoid stressful situations. Live a simple life and avoid aggravations. Organize your time and prepare for the next day  in anticipation. 6. Sleep well, avoid any interruptions of sleep and avoid any distractions in the bedroom that may interfere with adequate sleep quality 7. Avoid sugar, avoid sweets as there is a strong link between excessive sugar intake, diabetes, and cognitive impairment We discussed the Mediterranean diet, which has been shown to help patients reduce the risk of progressive memory disorders and reduces cardiovascular risk. This includes eating fish, eat fruits and green leafy vegetables, nuts like almonds and hazelnuts, walnuts, and also use olive oil. Avoid fast foods and fried foods as much as possible. Avoid sweets and sugar as sugar use has been linked to worsening of memory function.  There is always a concern of gradual progression of memory problems. If this is the case, then we may need to adjust level of care according to patient needs. Support, both to the patient and caregiver, should then be put into place.    FALL PRECAUTIONS: Be cautious when walking. Scan the area for obstacles that may increase the risk of trips and falls. When getting up in the mornings, sit up at the edge of the bed for a few minutes before getting out of bed. Consider elevating the bed at the head end to avoid drop of blood pressure when getting up. Walk always in a well-lit room (use night lights in the walls). Avoid area rugs or power cords from appliances in the middle of the walkways. Use a walker or a cane if necessary and consider physical therapy for balance exercise. Get your eyesight checked regularly.  FINANCIAL OVERSIGHT: Supervision, especially oversight when making financial decisions or transactions is also recommended.  HOME SAFETY: Consider the safety of the kitchen when operating appliances like stoves, microwave oven, and blender. Consider having supervision and share cooking responsibilities until no longer able to participate in those. Accidents with firearms and other hazards in the house  should be identified and addressed as well.   ABILITY TO BE LEFT ALONE: If patient is unable to contact 911 operator, consider using LifeLine, or when the need is there, arrange for someone to stay with patients. Smoking is a fire hazard, consider supervision or cessation. Risk of wandering should be assessed by caregiver and if detected at any point, supervision and safe proof recommendations should be instituted.  MEDICATION SUPERVISION: Inability to self-administer medication needs to be constantly addressed. Implement a mechanism to ensure safe administration of the medications.   DRIVING: Regarding driving, in patients with progressive memory problems, driving will be impaired. We advise to have someone else do the driving if trouble finding directions or if minor accidents are reported. Independent driving assessment is available to determine safety of driving.   If you are interested in the driving assessment, you can contact the following:  [employer] in [place]  [facility] [phone]    Limit use of pain relievers to no more than 2 days out of the week.  These medications include acetaminophen, NSAIDs (ibuprofen/Advil/Motrin, naproxen/Aleve, triptans (Imitrex/sumatriptan), Excedrin, and narcotics.  This will help reduce risk of rebound headaches. Be aware of common food triggers:  - Caffeine:  coffee, black tea, cola, Mt. Dew  - Chocolate  - Dairy:  aged cheeses (brie, blue, cheddar, gouda, [place], provolone, [place], Swiss, etc), chocolate milk, buttermilk, sour cream, limit eggs and yogurt  - Nuts, peanut butter  - Alcohol  - Cereals/grains:  FRESH breads (fresh bagels, sourdough, doughnuts), yeast productions  - Processed/canned/aged/cured meats (pre-packaged deli meats, hotdogs)  - MSG/glutamate:  soy sauce, flavor enhancer, pickled/preserved/marinated foods  - Sweeteners:  aspartame  (Equal, Nutrasweet).  Sugar and Splenda are okay  - Vegetables:  legumes (lima beans, lentils, snow peas, fava beans, pinto peans, peas, garbanzo beans), sauerkraut, onions, olives, pickles  - Fruit:  avocados, bananas, citrus fruit (orange, lemon, grapefruit), mango  - Other:  Frozen meals, macaroni and cheese Routine exercise Stay adequately hydrated (aim for 64 oz water daily) Keep headache diarMaintain proper stress management Maintain proper sleep hygiene Do not skip meals Consider supplements:  magnesium citrate 400mg  daily, riboflavin 400mg  daily, coenzyme Q10 100mg  three times daily.   [facility] [phone] or [phone]

## 2022-03-08 NOTE — Progress Notes (Signed)
Assessment/Plan:    Dementia due to Alzheimer's disease  [name] is a very pleasant 82 y.o. RH female with a history of hypertension, hyperlipidemia, anxiety, depression, CAD, chronic bronchitis and a history of Alzheimer's dementia with behavioral disturbance seen today in follow up for memory loss. Patient is currently on rivastigmine 1.5 mg twice daily by PCP. MRI brain personally reviewed was remarkable for mild for age chronic microvascular ischemic disease. Slight cognitive decline is noted. MMSE is 23/30, however she is able to perform  several of the ADLs.       Follow up in  6  months. Increase  rivastigmine 3 mg twice daily by PCP, side effects discussed Continue to control mood as per PCP Recommend good control of cardiovascular risk factors.       Subjective:    This patient is accompanied in the office by her son who supplements the history.  Previous records as well as any outside records available were reviewed prior to todays visit. Patient was last seen on 08/28/2021.  Last MoCA on 04/29/2021 was 18/30.    Any changes in memory since last visit?  She has difficulty remembering recent conversations "I have to write it down a lot". She still reads. Likes to go out, with her son and also goes to [place] with her friends.    repeats oneself?  Endorsed Disoriented when walking into a room?  Patient denies   Leaving objects in unusual places?    denies   Wandering behavior?  denies   Any personality changes since last visit?  denies   Any worsening depression?:  denies   Hallucinations or paranoia?  denies   Seizures?    denies    Any sleep changes?  Denies vivid dreams, REM behavior or sleepwalking.  Takes trazodone with good results. Sleep apnea?   denies   Any hygiene concerns?  denies   Independent of bathing and dressing?   Someone comes 2-3 times a week to help her with baths, etc.  Does the patient needs help with medications?  Son is in charge   Who is in  charge of the finances? Son  is in charge     Any changes in appetite?  "Good, she likes her Cheerios"    Patient have trouble swallowing?  denies   Does the patient cook? Son does all the cooking  Any headaches?   denies   Chronic back pain  denies   Ambulates with difficulty?  Uses a R cane for stability  Recent falls or head injuries? denies    Unilateral weakness, numbness or tingling? denies  Uses a brace for stability  Any tremors?  denies   Any anosmia?  Patient denies   Any incontinence of urine?  denies   Any bowel dysfunction?     denies      Patient lives  with her son Does the patient drive? Does not drive   Neuropsychological evaluation 01/06/2022 briefly, results suggested profound impairments surrounding all aspects of learning and memory. Additionally significant impairments were exhibited across confrontation naming, semantic fluency, and cognitive flexibility. Variability but generally below expectation performances were noted across visuospatial abilities. Regarding etiology, testing patterns are unfortunately suggestive of underlying Alzheimer's disease. Ms. Oconnor did not benefit from the repeated presentation of information, was fully amnestic (i.e., 0% retention) across all memory tasks, and performed very poorly across yes/no recognition trials. Taken together, this suggests rapid forgetting and a significant memory storage impairment, both of which are the  hallmark characteristics of this illness. Furthermore, impairments in confrontation naming and semantic fluency would follow classic Alzheimer's disease progression, making this illness more likely.     Initial visit 04/29/2021 the patient is seen in neurologic consultation at the request of Lance Bosch, NP for the evaluation of memory.  The patient is accompanied by her sister who supplements the history. This is a 82 y.o. year old RH  female who has had memory issues for about 1 to 2 years, when she began to notice  issues with her short-term memory.  The symptoms exacerbated about 3 or 4 months ago, after a fall requiring hospitalization.  She is currently on rivastigmine 1.5 mg twice daily by her PCP, dating at least to 2020 however.  Her sister reports that she repeats herself, but denies being disoriented when walking into her room.  She denies leaving objects in unusual places.  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  At the time of her hospitalization, her status has been complicated by a UTI, with acute mental status changes, and per patient report, "they gave me Klonopin, which made me even look.".  She denies any head injuries.  No wandering behavior.  She no longer drives since 2020 after having a "couple of bumpups".  Currently, she [facility] in [facility], she does not like living there, she feels that they are people constantly coming into her room and taking staff.  Her sister corroborates this, denies that the patient is being paranoid about it.  She denies hallucinations.  Her mood is anxious, and she has undergone significant depression due to family dynamics.  Apparently, she is estranged from her daughter, has not communicated with her in several years, and she is also estranged from the rest of her family except for her nephew, who is very involved in her care.  He would like to be the power of attorney, and take care of all her legal issues.  "There are a lot of scars that did not heal from the past, especially since my daughter has come weird and does not want to talk to me ".  She is also depressed because office on who died about 2 years ago, bring in severe depression to the patient.  She still in the process of grieving.  She is a former Programmer, [employer] of [employer] ".  She enjoys doing crossword puzzles, word finding.  She denies any hygiene concerns, she is independent of bathing and dressing, and the facility provides the  medications.  Her son is in charge of the finances.  Her appetite is normal, denies trouble swallowing.  She does not cook.  She has occasional headaches, denies double vision, dizziness, focal numbness or tingling, unilateral weakness, tremors or anosmia.  No history of sleep apnea, alcohol or tobacco.  Family history remarkable for vascular dementia in mother.    MRI of the brain 05/19/2021 no evidence of acute intracranial abnormality.  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    PREVIOUS MEDICATIONS:   CURRENT MEDICATIONS:  Outpatient Encounter Medications as of 03/08/2022  Medication Sig   Acetaminophen 500 MG coapsule Take 1 tablet by mouth daily as needed.   Acetylcarn-Alpha Lipoic Acid 400-200 MG CAPS Take 1 tablet by mouth daily.     albuterol (VENTOLIN HFA) 108 (90 Base) MCG/ACT inhaler Inhale 2 puffs into the lungs every 6 (six) hours as  needed.   ALPRAZolam (XANAX) 0.5 MG tablet Take 0.5 mg by mouth 2 (two) times daily.    Ascorbic Acid (VITAMIN C) 1000 MG tablet Take 1,000 mg by mouth daily.   aspirin 81 MG tablet Take 81 mg by mouth daily.     atenolol (TENORMIN) 100 MG tablet Take 100 mg by mouth daily.   benzonatate (TESSALON) 200 MG capsule Take 1 capsule (200 mg total) by mouth every 8 (eight) hours as needed for cough.   buPROPion (WELLBUTRIN SR) 150 MG 12 hr tablet Take 150 mg by mouth 2 (two) times daily.     buPROPion (WELLBUTRIN SR) 150 MG 12 hr tablet Take by mouth.   calcium carbonate (OS-CAL) 1250 (500 Ca) MG chewable tablet Chew by mouth.   CALCIUM-MAG-VIT C-VIT D PO Take 1 tablet by mouth daily.     cetirizine (ZYRTEC) 10 MG tablet Take 10 mg by mouth daily.   cholecalciferol (VITAMIN D) 1000 UNITS tablet Take 1,000 Units by mouth daily.     Cyanocobalamin (VITAMIN B 12 PO) Take 2,500 mcg by mouth daily.     EPINEPHrine (EPI-PEN) 0.3 mg/0.3 mL SOAJ injection Inject 0.3 mLs (0.3 mg total)  into the muscle once.   Evolocumab (REPATHA SURECLICK) 140 MG/ML SOAJ Inject into the skin.   ezetimibe (ZETIA) 10 MG tablet Take 1 tablet by mouth daily.   Flaxseed, Linseed, (FLAX SEED OIL) 1000 MG CAPS Take 1 capsule by mouth 3 (three) times daily.     fluticasone (FLONASE) 50 MCG/ACT nasal spray Place 2 sprays into the nose daily.   hydrochlorothiazide (MICROZIDE) 12.5 MG capsule Take by mouth.   losartan (COZAAR) 100 MG tablet Take 100 mg by mouth daily.     losartan (COZAAR) 25 MG tablet Take 1 tablet by mouth daily.   melatonin 3 MG TABS tablet Take by mouth.   Multiple Vitamins-Minerals (CENTRUM SILVER PO) Take by mouth 1 day or 1 dose.   Multiple Vitamins-Minerals (HAIR/SKIN/NAILS) TABS Take 1 tablet by mouth daily.   NITROSTAT 0.4 MG SL tablet    NONFORMULARY OR COMPOUNDED ITEM Allergy Vaccine 1:10 Given at Home   pantoprazole (PROTONIX) 40 MG tablet Take by mouth.   Potassium 99 MG TABS Take 1 tablet by mouth daily.     rivastigmine (EXELON) 1.5 MG capsule Take by mouth.   traZODone (DESYREL) 50 MG tablet Take by mouth.   venlafaxine XR (EFFEXOR-XR) 75 MG 24 hr capsule Take by mouth.   No facility-administered encounter medications on file as of 03/08/2022.        No data to display            04/29/2021    1:00 PM  Montreal Cognitive Assessment   Visuospatial/ Executive (0/5) 2  Naming (0/3) 2  Attention: Read list of digits (0/2) 2  Attention: Read list of letters (0/1) 1  Attention: Serial 7 subtraction starting at 100 (0/3) 0  Language: Repeat phrase (0/2) 2  Language : Fluency (0/1) 1  Abstraction (0/2) 1  Delayed Recall (0/5) 1  Orientation (0/6) 6  Total 18  Adjusted Score (based on education) 18    Objective:     PHYSICAL EXAMINATION:    VITALS:   Vitals:   03/08/22 1531  Resp: 18  Weight: 163 lb (73.9 kg)  Height: 5\' 5"  (1.651 m)    GEN:  The patient appears stated age and is in NAD. HEENT:  Normocephalic, atraumatic.   Neurological  examination:  General: NAD, well-groomed, appears  stated age. Orientation: The patient is alert. Oriented to person, place and not to date Cranial nerves: There is good facial symmetry.The speech is fluent and clear. No aphasia or dysarthria. Fund of knowledge is appropriate. Recent and remote memory are impaired. Attention and concentration are reduced.  Able to name objects and repeat phrases.  Hearing is intact to conversational tone.    Sensation: Sensation is intact to light touch throughout Motor: Strength is at least antigravity x4. DTR's 2/4 in UE/LE     Movement examination: Tone: There is normal tone in the UE/LE Abnormal movements:  no tremor.  No myoclonus.  No asterixis.   Coordination:  There is no decremation with RAM's. Normal finger to nose  Gait and Station: The patient has some difficulty arising out of a deep-seated chair without the use of the hands.  Needs her cane. The patient's stride length is good.  Gait is cautious and narrow.    Thank you for allowing [place] the opportunity to participate in the care of this nice patient. Please do not hesitate to contact us for any questions or concerns.   Total time spent on today's visit was 33 minutes dedicated to this patient today, preparing to see patient, examining the patient, ordering tests and/or medications and counseling the patient, documenting clinical information in the EHR or other health record, independently interpreting results and communicating results to the patient/family, discussing treatment and goals, answering patient's questions and coordinating care.  Cc:  Ferd Hibbs, NP  Sharene Butters 03/08/2022 3:42 PM

## 2022-09-06 ENCOUNTER — Ambulatory Visit: Payer: Medicare Other | Admitting: Physician Assistant

## 2022-12-13 IMAGING — MR MR HEAD W/O CM
12 series · 48 of 48 positions shown · non-contrast
Comparison: None.

CLINICAL DATA: Memory loss.

EXAM:
MRI HEAD WITHOUT CONTRAST
TECHNIQUE: Multiplanar, multiecho pulse sequences of the brain and surrounding
structures were obtained without intravenous contrast.

[Series 5: T1 · sagittal · 4.0mm · 0.75mm/px · 1 of 28 slices shown (1 of 3)]
[im 1/28]
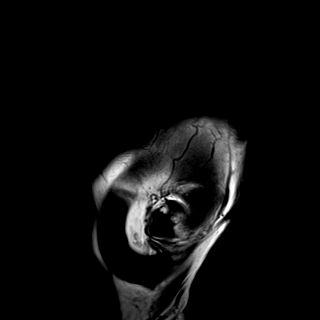

[Series 6: DWI · axial · 3.0mm · 0.94mm/px · z∈[-88,+59]mm · 11 of 168 slices shown (1 of 3)]
[im 1/168]
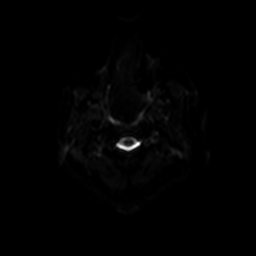
[im 17/168]
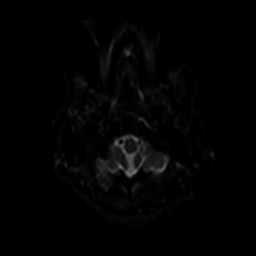
[im 34/168]
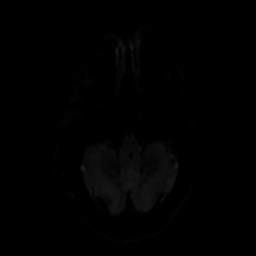
[im 51/168]
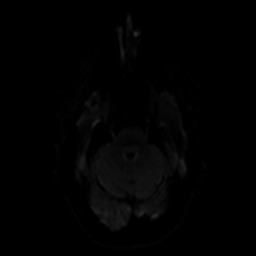
[im 67/168]
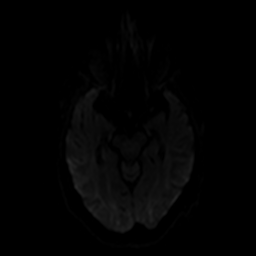
[im 84/168]
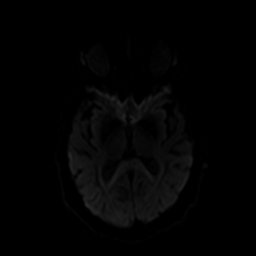
[im 101/168]
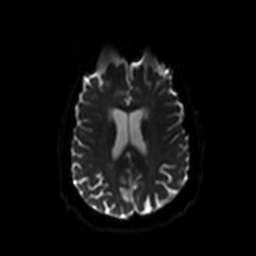
[im 117/168]
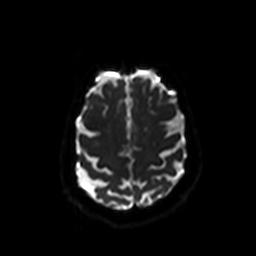
[im 134/168]
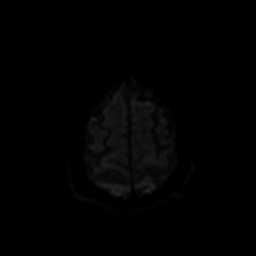
[im 151/168]
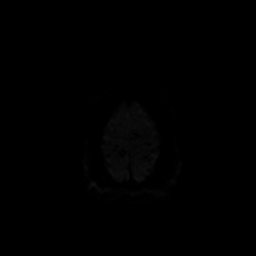
[im 168/168]
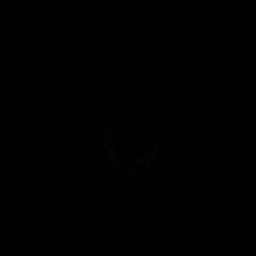

[Series 7: ax dwi_tracew · axial · 3.0mm · 0.94mm/px · z∈[-88,+59]mm · 6 of 84 slices shown]
[im 1/84]
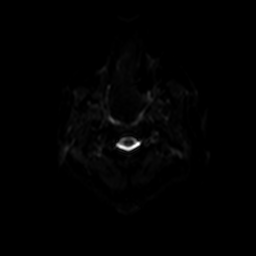
[im 17/84]
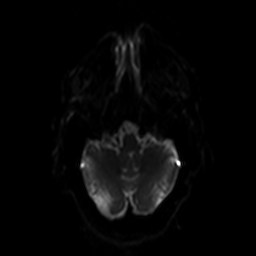
[im 34/84]
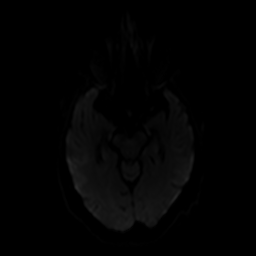
[im 50/84]
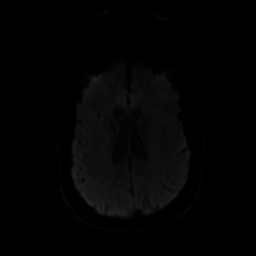
[im 67/84]
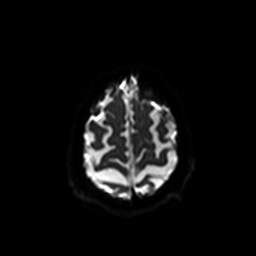
[im 84/84]
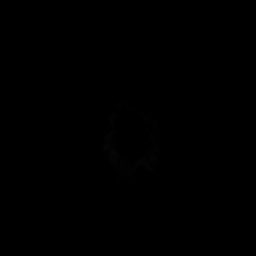

[Series 8: ax dwi_adc · axial · 3.0mm · 0.94mm/px · z∈[-88,+59]mm · 3 of 42 slices shown]
[im 1/42]
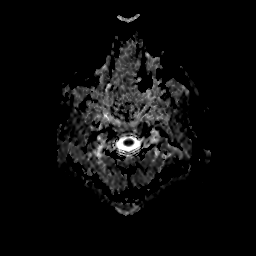
[im 21/42]
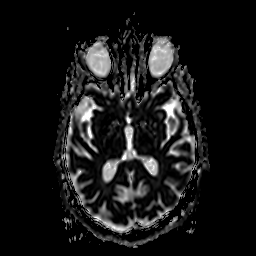
[im 42/42]
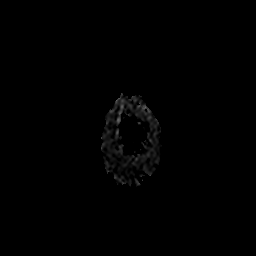

[Series 9: DWI · coronal · 5.0mm · 1.44mm/px · 4 of 60 slices shown (2 of 3)]
[im 1/60]
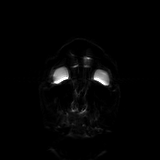
[im 20/60]
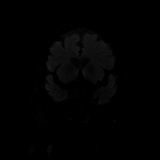
[im 40/60]
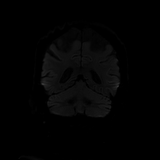
[im 60/60]
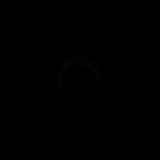

[Series 10: DWI · coronal · 5.0mm · 1.44mm/px · 2 of 30 slices shown (3 of 3)]
[im 1/30]
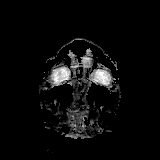
[im 30/30]
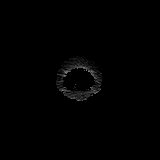

[Series 11: T2 · axial · 4.0mm · 0.36mm/px · z∈[-80,+65]mm · 2 of 29 slices shown (1 of 2)]
[im 1/29]
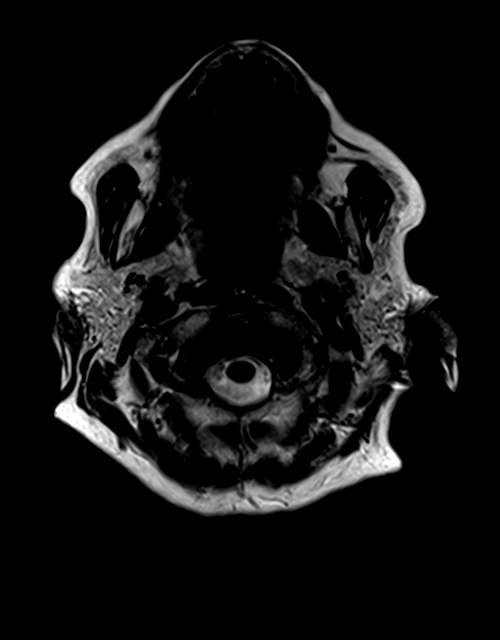
[im 29/29]
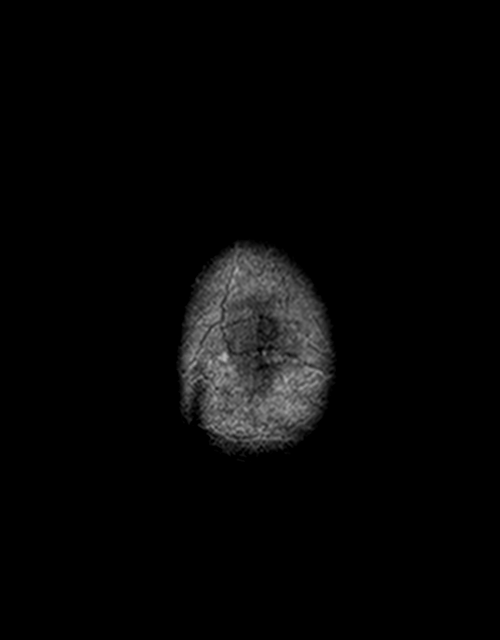

[Series 12: FLAIR · axial · 3.0mm · 0.72mm/px · z∈[-85,+64]mm · 2 of 26 slices shown]
[im 1/26]
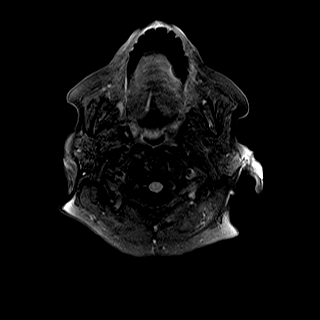
[im 26/26]
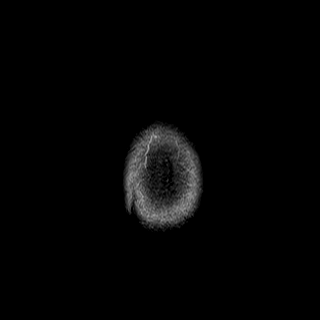

[Series 14: swi_images · axial · 3.0mm · 0.90mm/px · z∈[-84,+68]mm · 3 of 52 slices shown]
[im 1/52]
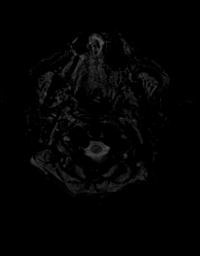
[im 26/52]
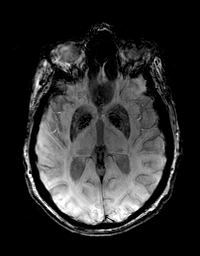
[im 52/52]
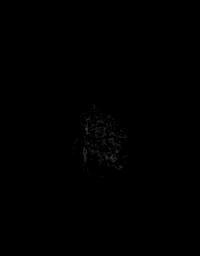

[Series 15: T1 · sagittal · 4.0mm · 0.75mm/px · 2 of 28 slices shown (2 of 3)]
[im 1/28]
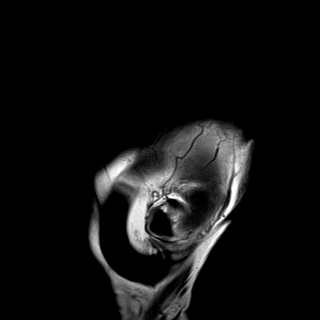
[im 28/28]
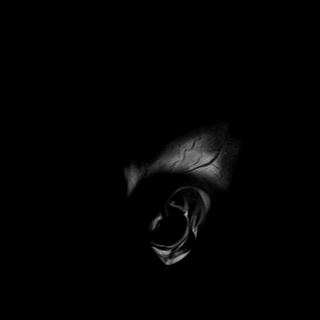

[Series 16: T1 · axial · 1.0mm · 0.90mm/px · z∈[-84,+59]mm · 10 of 144 slices shown (3 of 3)]
[im 1/144]
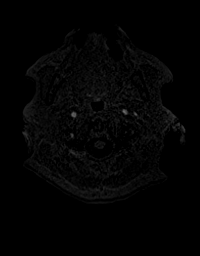
[im 16/144]
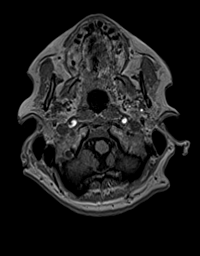
[im 32/144]
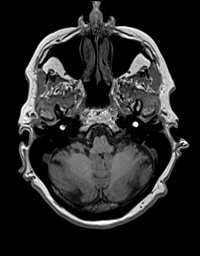
[im 48/144]
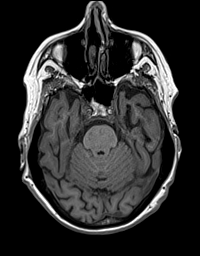
[im 64/144]
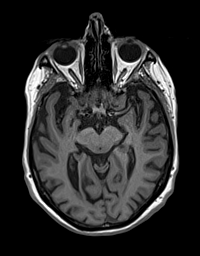
[im 80/144]
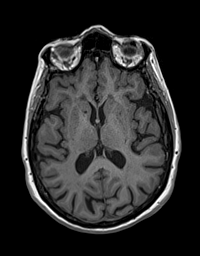
[im 96/144]
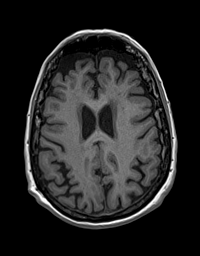
[im 112/144]
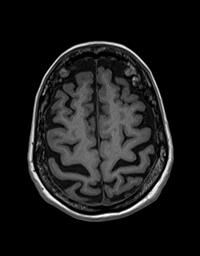
[im 128/144]
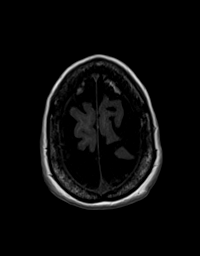
[im 144/144]
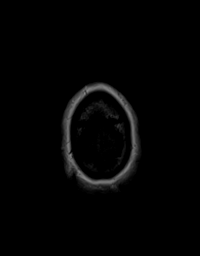

[Series 17: T2 · coronal · 4.5mm · 0.36mm/px · 2 of 30 slices shown (2 of 2)]
[im 1/30]
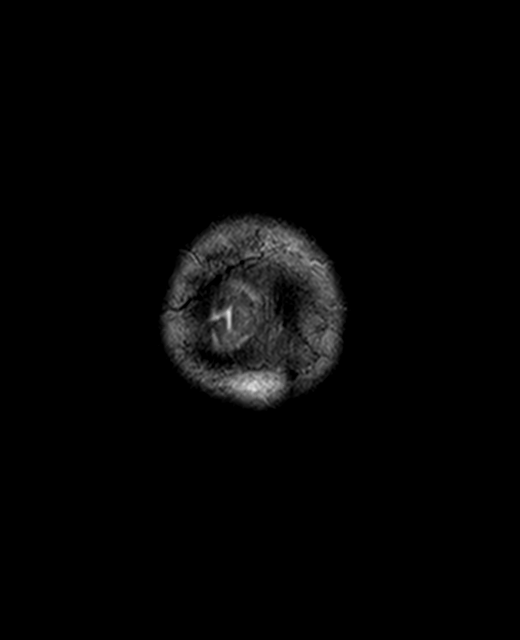
[im 30/30]
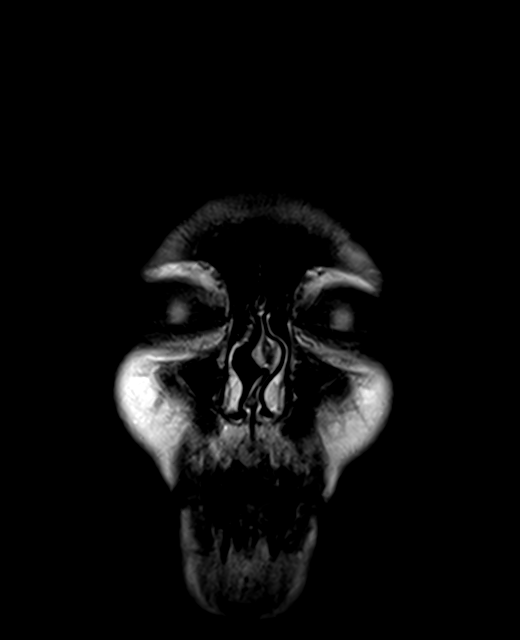

[48 of 48 positions shown; findings below may reference images not displayed]

FINDINGS: Motion limited.

Brain: No acute infarction, hemorrhage, hydrocephalus, extra-axial
collection or mass lesion. Small remote infarct in the left frontal
periventricular region. Additional mild for age scattered T2/FLAIR
hyperintensities in the white matter, nonspecific but compatible
with chronic microvascular ischemic disease.

Vascular: Major arterial flow voids are maintained at the skull
base.

Skull and upper cervical spine: Nonspecific area of T2
hyperintensity within the clivus, probably benign. Otherwise, no
abnormal marrow signal.

Sinuses/Orbits: Clear sinuses.  No acute orbital findings.

Other: No mastoid effusions.
IMPRESSION: 1. No evidence of acute intracranial abnormality.
2. Mild for age chronic microvascular ischemic disease.

## 2023-03-29 ENCOUNTER — Other Ambulatory Visit: Payer: Self-pay | Admitting: Physician Assistant

## 2023-04-14 ENCOUNTER — Other Ambulatory Visit: Payer: Self-pay | Admitting: Physician Assistant
